# Patient Record
Sex: Female | Born: 1985 | Race: Black or African American | Hispanic: No | Marital: Single | Smoking: Never smoker
Health system: Southern US, Community
[De-identification: ages and names within clinical notes are randomized; demographics above are authoritative.]

## PROBLEM LIST (undated history)

## (undated) ENCOUNTER — Inpatient Hospital Stay (HOSPITAL_COMMUNITY): Payer: Self-pay

## (undated) DIAGNOSIS — D649 Anemia, unspecified: Secondary | ICD-10-CM

## (undated) DIAGNOSIS — Z889 Allergy status to unspecified drugs, medicaments and biological substances status: Secondary | ICD-10-CM

## (undated) HISTORY — PX: NO PAST SURGERIES: SHX2092

---
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
## 2008-01-02 ENCOUNTER — Emergency Department (HOSPITAL_COMMUNITY): Admission: EM | Admit: 2008-01-02 | Discharge: 2008-01-03 | Payer: Self-pay | Admitting: Emergency Medicine

## 2011-04-30 LAB — URINALYSIS, ROUTINE W REFLEX MICROSCOPIC
Bilirubin Urine: NEGATIVE
Glucose, UA: NEGATIVE
Ketones, ur: NEGATIVE
Nitrite: NEGATIVE
Protein, ur: NEGATIVE
Specific Gravity, Urine: 1.031 — ABNORMAL HIGH
Urobilinogen, UA: 1
pH: 6

## 2011-04-30 LAB — DIFFERENTIAL
Eosinophils Relative: 2
Monocytes Absolute: 0.6
Monocytes Relative: 6
Neutrophils Relative %: 65

## 2011-04-30 LAB — POCT I-STAT, CHEM 8
BUN: 10
HCT: 35 — ABNORMAL LOW
Hemoglobin: 11.9 — ABNORMAL LOW
Sodium: 141
TCO2: 28

## 2011-04-30 LAB — GC/CHLAMYDIA PROBE AMP, GENITAL
Chlamydia, DNA Probe: POSITIVE — AB
GC Probe Amp, Genital: NEGATIVE

## 2011-04-30 LAB — URINE MICROSCOPIC-ADD ON

## 2011-04-30 LAB — CBC
MCV: 68.7 — ABNORMAL LOW
Platelets: 284
WBC: 9.6

## 2011-04-30 LAB — POCT PREGNANCY, URINE
Operator id: 261601
Preg Test, Ur: NEGATIVE

## 2011-04-30 LAB — WET PREP, GENITAL: Trich, Wet Prep: NONE SEEN

## 2012-08-04 NOTE — L&D Delivery Note (Signed)
Delivery Note  As pt was being placed in stirrups, the vtx began crowning. After one push, at 4:55 AM a viable female was delivered via Vaginal, Spontaneous Delivery (Presentation: Left Occiput Anterior).  APGAR: 8, 9; weight pending.   Placenta status: , Spontaneous.  Cord: 3 vessels with the following complications: None.   Anesthesia: Epidural  Lacerations: 1st degree;vaginal wall Suture Repair: vicryl 4-0 Est. Blood Loss (mL): 250  Mom to postpartum.  Baby to nursery-stable.  [name] 02/09/2013, 5:24 AM  Due to precipitous nature of delivery, I was not present at the delivery.  I was present a few minutes after, and agree with above assessment.

## 2012-12-30 ENCOUNTER — Encounter (HOSPITAL_COMMUNITY): Payer: Self-pay | Admitting: *Deleted

## 2012-12-30 ENCOUNTER — Inpatient Hospital Stay (HOSPITAL_COMMUNITY)
Admission: AD | Admit: 2012-12-30 | Discharge: 2012-12-31 | Disposition: A | Discharge status: Home / Self Care | Payer: Medicaid - Out of State | Source: Ambulatory Visit | Attending: Family Medicine | Admitting: Family Medicine

## 2012-12-30 DIAGNOSIS — O99891 Other specified diseases and conditions complicating pregnancy: Secondary | ICD-10-CM | POA: Insufficient documentation

## 2012-12-30 DIAGNOSIS — Z3493 Encounter for supervision of normal pregnancy, unspecified, third trimester: Secondary | ICD-10-CM

## 2012-12-30 DIAGNOSIS — N899 Noninflammatory disorder of vagina, unspecified: Secondary | ICD-10-CM | POA: Insufficient documentation

## 2012-12-30 DIAGNOSIS — H1130 Conjunctival hemorrhage, unspecified eye: Secondary | ICD-10-CM

## 2012-12-30 DIAGNOSIS — J069 Acute upper respiratory infection, unspecified: Secondary | ICD-10-CM | POA: Insufficient documentation

## 2012-12-30 HISTORY — DX: Anemia, unspecified: D64.9

## 2012-12-30 HISTORY — DX: Allergy status to unspecified drugs, medicaments and biological substances: Z88.9

## 2012-12-30 LAB — URINALYSIS, ROUTINE W REFLEX MICROSCOPIC
Glucose, UA: NEGATIVE mg/dL
Specific Gravity, Urine: 1.01 (ref 1.005–1.030)
pH: 6 (ref 5.0–8.0)

## 2012-12-30 LAB — URINE MICROSCOPIC-ADD ON

## 2012-12-30 MED ORDER — GUAIFENESIN ER 600 MG PO TB12: 1200.0000 mg | ORAL_TABLET | Freq: Two times a day (BID) | ORAL | Status: DC

## 2012-12-30 MED ORDER — PSEUDOEPHEDRINE HCL 60 MG PO TABS: 60.0000 mg | ORAL_TABLET | ORAL | Status: DC | PRN

## 2012-12-30 NOTE — Discharge Instructions (Signed)

## 2012-12-30 NOTE — MAU Provider Note (Signed)
History     CSN: [id number]  Arrival date and time: 12/30/12 1945   First Provider Initiated Contact with Patient 12/30/12 2303      Chief Complaint  Patient presents with  . Sinus Problem  . Eye Problem   HPI Ms Fesler is a 26yo G1 at 34.3wks by LMP who presents for eval of various general symptoms and a 'check-up' as she has not had prenatal care other than a few ER visits in [place] earlier in the pregnancy. At this time she complains of a painful 'boil' on her vagina x 1wk that is starting to resolve now and a cough and sinus pressure recently. Denies fever, UTI s/s, diarrhea, leaking, bldg, ctx and reports +FM. Has applied for [facility] already.  OB History   Grav Para Term Preterm Abortions TAB SAB Ect Mult Living   1               Past Medical History  Diagnosis Date  . Anemia   . H/O seasonal allergies     Past Surgical History  Procedure Laterality Date  . No past surgeries      Family History  Problem Relation Age of Onset  . Hypertension Mother   . Hypertension Father   . Hypertension Maternal Uncle   . Kidney disease Maternal Uncle   . Diabetes Maternal Uncle   . Hypertension Maternal Grandmother   . Cancer Maternal Grandmother   . Hypertension Maternal Grandfather   . Hypertension Paternal Grandmother   . Hypertension Paternal Grandfather     History  Substance Use Topics  . Smoking status: Never Smoker   . Smokeless tobacco: Not on file  . Alcohol Use: Yes    Allergies: No Known Allergies  Prescriptions prior to admission  Medication Sig Dispense Refill  . Prenatal Vit-Fe Fumarate-FA (PRENATAL MULTIVITAMIN) TABS Take 1 tablet by mouth daily at 12 noon.        ROS Physical Exam   Blood pressure 136/88, pulse 88, temperature 98.2 F (36.8 C), temperature source Oral, resp. rate 20, height 5\' 10"  (1.778 m), weight 163.93 kg (361 lb 6.4 oz), last menstrual period 05/03/2012.  Physical Exam  Constitutional: She is oriented to person, place, and  time. She appears well-developed.  Morbidly obese  HENT:  Head: Normocephalic.  Eyes:  Bilateral subconjunctival hemorrhages  Neck: Normal range of motion.  Cardiovascular: Normal rate.   Respiratory: Effort normal.  GI:  Very obese abd; fundal height not performed  FHR 135 +accels, no decels No ctx per toco  Genitourinary:  Right labia: approx 1cm crater-like lesion; sm amber d/c; sm bleeding noted during culture collection; very tender to palp  Musculoskeletal: Normal range of motion.  Neurological: She is alert and oriented to person, place, and time.  Skin: Skin is warm and dry.  Psychiatric: She has a normal mood and affect. Her behavior is normal. Thought content normal.   Urinalysis    Component Value Date/Time   COLORURINE YELLOW 12/30/2012 2014   APPEARANCEUR HAZY* 12/30/2012 2014   LABSPEC 1.010 12/30/2012 2014   PHURINE 6.0 12/30/2012 2014   GLUCOSEU NEGATIVE 12/30/2012 2014   HGBUR SMALL* 12/30/2012 2014   BILIRUBINUR NEGATIVE 12/30/2012 2014   KETONESUR NEGATIVE 12/30/2012 2014   PROTEINUR NEGATIVE 12/30/2012 2014   UROBILINOGEN 0.2 12/30/2012 2014   NITRITE NEGATIVE 12/30/2012 2014   LEUKOCYTESUR LARGE* 12/30/2012 2014      MAU Course  Procedures  MDM Pt declines any prev dx of HSV II; culture  collected from labial lesion  Assessment and Plan  IUP at 34.3wks by LMP No PNC Lesion suspicious for HSV URI Bilat subconjunctival hemorrhages  D/C home Inbox msg to [facility] for NOB appt Outpt anatomy U/S ordered UDS pending HSV culture pending Urine culture pending Rev'd subconjunctival hemorrhages most likely from strong coughing Rec OTC Mucinex and Sudafed for URI  Flynt Breeze 12/30/2012, 11:27 PM

## 2012-12-30 NOTE — MAU Note (Signed)
Pt LMP 05/03/2012, no prenatal care.  Recently moved here from [place].  Having sinus issues, left eye swollen.  Both eyes with broken blood vessels.  Vaginal boil x 1 wk.

## 2012-12-31 ENCOUNTER — Telehealth: Payer: Self-pay | Admitting: *Deleted

## 2012-12-31 LAB — RAPID URINE DRUG SCREEN, HOSP PERFORMED
Amphetamines: NOT DETECTED
Cocaine: NOT DETECTED
Opiates: NOT DETECTED
Tetrahydrocannabinol: NOT DETECTED

## 2012-12-31 NOTE — MAU Provider Note (Signed)
Chart reviewed and agree with management and plan.  

## 2012-12-31 NOTE — Telephone Encounter (Signed)
Pt left message stating that she was seen @ MAU yesterday. She is supposed to get an appt for [place] and prenatal visit - wanting to know if these appts have been scheduled. I returned pt's call and informed her of [place] appt on 6/4 @ 1300 and clinic appt on 6/18 @ 0800.  Pt voiced understanding.

## 2013-01-01 LAB — URINE CULTURE

## 2013-01-02 ENCOUNTER — Other Ambulatory Visit: Payer: Self-pay | Admitting: Advanced Practice Midwife

## 2013-01-02 DIAGNOSIS — O2343 Unspecified infection of urinary tract in pregnancy, third trimester: Secondary | ICD-10-CM

## 2013-01-02 MED ORDER — AMOXICILLIN 500 MG PO TABS: 500.0000 mg | ORAL_TABLET | Freq: Two times a day (BID) | ORAL | Status: DC

## 2013-01-02 NOTE — Progress Notes (Signed)
Urine culture + e coli, rx amoxicillin 500 mg i po bid x 7 days.

## 2013-01-05 ENCOUNTER — Ambulatory Visit (HOSPITAL_COMMUNITY)
Admission: RE | Admit: 2013-01-05 | Discharge: 2013-01-05 | Disposition: A | Discharge status: Home / Self Care | Payer: Medicaid - Out of State | Source: Ambulatory Visit | Attending: Advanced Practice Midwife | Admitting: Advanced Practice Midwife

## 2013-01-05 DIAGNOSIS — E669 Obesity, unspecified: Secondary | ICD-10-CM | POA: Insufficient documentation

## 2013-01-05 DIAGNOSIS — O0930 Supervision of pregnancy with insufficient antenatal care, unspecified trimester: Secondary | ICD-10-CM | POA: Insufficient documentation

## 2013-01-05 DIAGNOSIS — Z3493 Encounter for supervision of normal pregnancy, unspecified, third trimester: Secondary | ICD-10-CM

## 2013-01-05 DIAGNOSIS — O99210 Obesity complicating pregnancy, unspecified trimester: Secondary | ICD-10-CM | POA: Insufficient documentation

## 2013-01-19 ENCOUNTER — Ambulatory Visit (INDEPENDENT_AMBULATORY_CARE_PROVIDER_SITE_OTHER): Payer: Medicaid Other | Admitting: Obstetrics and Gynecology

## 2013-01-19 ENCOUNTER — Encounter: Payer: Self-pay | Admitting: Obstetrics and Gynecology

## 2013-01-19 VITALS — BP 116/78 | Temp 98.7°F | Wt 355.8 lb

## 2013-01-19 DIAGNOSIS — R6889 Other general symptoms and signs: Secondary | ICD-10-CM

## 2013-01-19 DIAGNOSIS — O0930 Supervision of pregnancy with insufficient antenatal care, unspecified trimester: Secondary | ICD-10-CM

## 2013-01-19 DIAGNOSIS — O98319 Other infections with a predominantly sexual mode of transmission complicating pregnancy, unspecified trimester: Secondary | ICD-10-CM

## 2013-01-19 DIAGNOSIS — IMO0002 Reserved for concepts with insufficient information to code with codable children: Secondary | ICD-10-CM

## 2013-01-19 DIAGNOSIS — O0933 Supervision of pregnancy with insufficient antenatal care, third trimester: Secondary | ICD-10-CM | POA: Insufficient documentation

## 2013-01-19 DIAGNOSIS — A568 Sexually transmitted chlamydial infection of other sites: Secondary | ICD-10-CM

## 2013-01-19 DIAGNOSIS — A749 Chlamydial infection, unspecified: Secondary | ICD-10-CM | POA: Insufficient documentation

## 2013-01-19 DIAGNOSIS — N898 Other specified noninflammatory disorders of vagina: Secondary | ICD-10-CM

## 2013-01-19 DIAGNOSIS — O98813 Other maternal infectious and parasitic diseases complicating pregnancy, third trimester: Secondary | ICD-10-CM

## 2013-01-19 LAB — OB RESULTS CONSOLE GBS: GBS: NEGATIVE

## 2013-01-19 LAB — POCT URINALYSIS DIP (DEVICE)
Bilirubin Urine: NEGATIVE
Hgb urine dipstick: NEGATIVE
Nitrite: NEGATIVE
Protein, ur: NEGATIVE mg/dL
Urobilinogen, UA: 0.2 mg/dL (ref 0.0–1.0)
pH: 6.5 (ref 5.0–8.0)

## 2013-01-19 LAB — WET PREP, GENITAL: Trich, Wet Prep: NONE SEEN

## 2013-01-19 NOTE — Addendum Note (Signed)
Addended by: Sherre Lain A on: 01/19/2013 03:36 PM   Modules accepted: Orders

## 2013-01-19 NOTE — Addendum Note (Signed)
Addended by: Franchot Mimes on: 01/19/2013 11:35 AM   Modules accepted: Orders

## 2013-01-19 NOTE — Progress Notes (Signed)
Subjective:    Beth Calderon is a G1P0 [redacted]w[redacted]d being seen today for her first obstetrical visit. NPC and recent move from [place] to live with mother. FOB involved. Had sporadic ED care in [place] including bedside US>no records. Her obstetrical history is significant for morbid obesity, chlamydia not treated, few clues on prior WP. LMP known but states had normal menses 2 wks prior. [place] at 34 wks 1 wk less advanced with discordant measurements. Patient does intend to breast feed. Pregnancy history fully reviewed.  Patient reports no bleeding, no contractions and no leaking.  Filed Vitals:   01/19/13 0750  BP: 116/78  Temp: 98.7 F (37.1 C)  Weight: 355 lb 12.8 oz (161.39 kg)    HISTORY: OB History   Grav Para Term Preterm Abortions TAB SAB Ect Mult Living   1              # Outc Date GA Lbr Len/2nd Wgt Sex Del Anes PTL Lv   1 CUR              Past Medical History  Diagnosis Date  . Anemia   . H/O seasonal allergies    Past Surgical History  Procedure Laterality Date  . No past surgeries     Family History  Problem Relation Age of Onset  . Hypertension Mother   . Hypertension Father   . Hypertension Maternal Uncle   . Kidney disease Maternal Uncle   . Diabetes Maternal Uncle   . Hypertension Maternal Grandmother   . Cancer Maternal Grandmother   . Hypertension Maternal Grandfather   . Hypertension Paternal Grandmother   . Hypertension Paternal Grandfather      Exam    Uterus:  Fundal Height: 42 cm  Pelvic Exam:    Perineum: Normal Perineum   Vulva: normal   Vagina:  normal discharge, wet prep done       Cervix: no bleeding following Pap and no lesions    Adnexa: normal adnexa   Bony Pelvis: capacious  System: Breast:  normal appearance, no masses or tenderness   Skin: normal coloration and turgor, no rashes    Neurologic: oriented, normal, grossly non-focal   Extremities: normal strength, tone, and muscle mass, no deformities   HEENT PERRLA   Mouth/Teeth  mucous membranes moist, pharynx normal without lesions and dental hygiene good   Neck supple and no masses   Cardiovascular: regular rate and rhythm, no murmurs or gallops   Respiratory:  appears well, vitals normal, no respiratory distress, acyanotic, normal RR, ear and throat exam is normal, neck free of mass or lymphadenopathy, chest clear, no wheezing, crepitations, rhonchi, normal symmetric air entry   Abdomen: term size, morbidly obese. FHR 144   Urinary: urethral meatus normal      Assessment:    Pregnancy: G1P0 Patient Active Problem List   Diagnosis Date Noted  . Insufficient prenatal care in third trimester 01/19/2013  . Chlamydia infection complicating pregnancy in third trimester 01/19/2013  . Morbid obesity 01/19/2013        Plan:     Initial labs drawn. Glucola done. GBS sent. Prenatal vitamins. Problem list reviewed and updated. Genetic Screening discussed Quad Screen: too late.  Ultrasound discussed; fetal survey: results reviewed.  Follow up in 1 weeks. 50% of 30 min visit spent on counseling and coordination of care.  F/U US scheduled due to discordant measurements on first, S>D, Unsure dating criteria.  Will treat CT here and advise no sex until  TOC and inform partner. WP done.  Isrrael Fluckiger 01/19/2013

## 2013-01-19 NOTE — Patient Instructions (Addendum)
Chlamydia, Female Chlamydia is an infection caused by bacteria. It is spread through sexual contact. Chlamydia can be in different areas of the body. These areas include the cervix, urethra, throat, or rectum. If you are infected, you must finish all treatments and follow up with a caregiver.  CAUSES  Chlamydia is a sexually transmitted disease. It is passed from an infected partner during intimate contact. This contact could be with the genitals, mouth, or rectal area. Infections can also be passed from mothers to babies during birth. SYMPTOMS  There may not be any symptoms. This is often the case early in the infection. Symptoms you may notice include:  Mild pain and discomfort when urinating.  Inflammation of the rectum.  Vaginal discharge.  Painful intercourse.  Abdominal pain.  Bleeding between menstrual periods. DIAGNOSIS  To diagnose this infection, your caregiver will do a pelvic exam. Cultures will be taken of the vagina, cervix, urine, and possibly the rectum to see if the infection is chlamydia. TREATMENT You will be given antibiotic medicines. Any sexual partners should also be treated, even if they do not show symptoms. Take the medicine for the prescribed length of time. If you are pregnant, do not take tetracycline-type antibiotics. HOME CARE INSTRUCTIONS   Take your antibiotics as directed. Finish them even if you start to feel better.  Only take over-the-counter or prescription medicines for pain, discomfort, or fever as directed by your caregiver.  Inform any sexual partners about the infection. They should be treated also.  Do not have sexual contact until your caregiver tells you it is okay.  Get plenty of rest.  Eat a well-balanced diet, and drink enough fluids to keep your urine clear or pale yellow.  Keep all follow-up appointments and tests. SEEK IMMEDIATE MEDICAL CARE IF:   Your symptoms return.  You have a fever. MAKE SURE YOU:   Understand these  instructions.  Will watch your condition.  Will get help right away if you are not doing well or get worse. Document Released: 04/30/2005 Document Revised: 10/13/2011 Document Reviewed: 03/08/2008 [facility] Patient Information 2014 [place], [place]. Pregnancy - Third Trimester The third trimester of pregnancy (the last 3 months) is a period of the most rapid growth for you and your baby. The baby approaches a length of 20 inches and a weight of 6 to 10 pounds. The baby is adding on fat and getting ready for life outside your body. While inside, babies have periods of sleeping and waking, sucking thumbs, and hiccuping. You can often feel small contractions of the uterus. This is false labor. It is also called Braxton-Hicks contractions. This is like a practice for labor. The usual problems in this stage of pregnancy include more difficulty breathing, swelling of the hands and feet from water retention, and having to urinate more often because of the uterus and baby pressing on your bladder.  PRENATAL EXAMS  Blood work may continue to be done during prenatal exams. These tests are done to check on your health and the probable health of your baby. Blood work is used to follow your blood levels (hemoglobin). Anemia (low hemoglobin) is common during pregnancy. Iron and vitamins are given to help prevent this. You may also continue to be checked for diabetes. Some of the past blood tests may be done again.  The size of the uterus is measured during each visit. This makes sure your baby is growing properly according to your pregnancy dates.  Your blood pressure is checked every prenatal visit.  This is to make sure you are not getting toxemia.  Your urine is checked every prenatal visit for infection, diabetes, and protein.  Your weight is checked at each visit. This is done to make sure gains are happening at the suggested rate and that you and your baby are growing normally.  Sometimes, an ultrasound is  performed to confirm the position and the proper growth and development of the baby. This is a test done that bounces harmless sound waves off the baby so your caregiver can more accurately determine a due date.  Discuss the type of pain medicine and anesthesia you will have during your labor and delivery.  Discuss the possibility and anesthesia if a cesarean section might be necessary.  Inform your caregiver if there is any mental or physical violence at home. Sometimes, a specialized non-stress test, contraction stress test, and biophysical profile are done to make sure the baby is not having a problem. Checking the amniotic fluid surrounding the baby is called an amniocentesis. The amniotic fluid is removed by sticking a needle into the belly (abdomen). This is sometimes done near the end of pregnancy if an early delivery is required. In this case, it is done to help make sure the baby's lungs are mature enough for the baby to live outside of the womb. If the lungs are not mature and it is unsafe to deliver the baby, an injection of cortisone medicine is given to the mother 1 to 2 days before the delivery. This helps the baby's lungs mature and makes it safer to deliver the baby. CHANGES OCCURING IN THE THIRD TRIMESTER OF PREGNANCY Your body goes through many changes during pregnancy. They vary from person to person. Talk to your caregiver about changes you notice and are concerned about.  During the last trimester, you have probably had an increase in your appetite. It is normal to have cravings for certain foods. This varies from person to person and pregnancy to pregnancy.  You may begin to get stretch marks on your hips, abdomen, and breasts. These are normal changes in the body during pregnancy. There are no exercises or medicines to take which prevent this change.  Constipation may be treated with a stool softener or adding bulk to your diet. Drinking lots of fluids, fiber in vegetables,  fruits, and whole grains are helpful.  Exercising is also helpful. If you have been very active up until your pregnancy, most of these activities can be continued during your pregnancy. If you have been less active, it is helpful to start an exercise program such as walking. Consult your caregiver before starting exercise programs.  Avoid all smoking, alcohol, non-prescribed drugs, herbs and "street drugs" during your pregnancy. These chemicals affect the formation and growth of the baby. Avoid chemicals throughout the pregnancy to ensure the delivery of a healthy infant.  Backache, varicose veins, and hemorrhoids may develop or get worse.  You will tire more easily in the third trimester, which is normal.  The baby's movements may be stronger and more often.  You may become short of breath easily.  Your belly button may stick out.  A yellow discharge may leak from your breasts called colostrum.  You may have a bloody mucus discharge. This usually occurs a few days to a week before labor begins. HOME CARE INSTRUCTIONS   Keep your caregiver's appointments. Follow your caregiver's instructions regarding medicine use, exercise, and diet.  During pregnancy, you are providing food for you and your baby.  Continue to eat regular, well-balanced meals. Choose foods such as meat, fish, milk and other low fat dairy products, vegetables, fruits, and whole-grain breads and cereals. Your caregiver will tell you of the ideal weight gain.  A physical sexual relationship may be continued throughout pregnancy if there are no other problems such as early (premature) leaking of amniotic fluid from the membranes, vaginal bleeding, or belly (abdominal) pain.  Exercise regularly if there are no restrictions. Check with your caregiver if you are unsure of the safety of your exercises. Greater weight gain will occur in the last 2 trimesters of pregnancy. Exercising helps:  Control your weight.  Get you in  shape for labor and delivery.  You lose weight after you deliver.  Rest a lot with legs elevated, or as needed for leg cramps or low back pain.  Wear a good support or jogging bra for breast tenderness during pregnancy. This may help if worn during sleep. Pads or tissues may be used in the bra if you are leaking colostrum.  Do not use hot tubs, steam rooms, or saunas.  Wear your seat belt when driving. This protects you and your baby if you are in an accident.  Avoid raw meat, cat litter boxes and soil used by cats. These carry germs that can cause birth defects in the baby.  It is easier to leak urine during pregnancy. Tightening up and strengthening the pelvic muscles will help with this problem. You can practice stopping your urination while you are going to the bathroom. These are the same muscles you need to strengthen. It is also the muscles you would use if you were trying to stop from passing gas. You can practice tightening these muscles up 10 times a set and repeating this about 3 times per day. Once you know what muscles to tighten up, do not perform these exercises during urination. It is more likely to cause an infection by backing up the urine.  Ask for help if you have financial, counseling, or nutritional needs during pregnancy. Your caregiver will be able to offer counseling for these needs as well as refer you for other special needs.  Make a list of emergency phone numbers and have them available.  Plan on getting help from family or friends when you go home from the hospital.  Make a trial run to the hospital.  Take prenatal classes with the father to understand, practice, and ask questions about the labor and delivery.  Prepare the baby's room or nursery.  Do not travel out of the city unless it is absolutely necessary and with the advice of your caregiver.  Wear only low or no heal shoes to have better balance and prevent falling. MEDICINES AND DRUG USE IN  PREGNANCY  Take prenatal vitamins as directed. The vitamin should contain 1 milligram of folic acid. Keep all vitamins out of reach of children. Only a couple vitamins or tablets containing iron may be fatal to a baby or young child when ingested.  Avoid use of all medicines, including herbs, over-the-counter medicines, not prescribed or suggested by your caregiver. Only take over-the-counter or prescription medicines for pain, discomfort, or fever as directed by your caregiver. Do not use aspirin, ibuprofen or naproxen unless approved by your caregiver.  Let your caregiver also know about herbs you may be using.  Alcohol is related to a number of birth defects. This includes fetal alcohol syndrome. All alcohol, in any form, should be avoided completely. Smoking will cause low  birth rate and premature babies.  Illegal drugs are very harmful to the baby. They are absolutely forbidden. A baby born to an addicted mother will be addicted at birth. The baby will go through the same withdrawal an adult does. SEEK MEDICAL CARE IF: You have any concerns or worries during your pregnancy. It is better to call with your questions if you feel they cannot wait, rather than worry about them. SEEK IMMEDIATE MEDICAL CARE IF:   An unexplained oral temperature above 102 F (38.9 C) develops, or as your caregiver suggests.  You have leaking of fluid from the vagina. If leaking membranes are suspected, take your temperature and tell your caregiver of this when you call.  There is vaginal spotting, bleeding or passing clots. Tell your caregiver of the amount and how many pads are used.  You develop a bad smelling vaginal discharge with a change in the color from clear to white.  You develop vomiting that lasts more than 24 hours.  You develop chills or fever.  You develop shortness of breath.  You develop burning on urination.  You loose more than 2 pounds of weight or gain more than 2 pounds of weight or  as suggested by your caregiver.  You notice sudden swelling of your face, hands, and feet or legs.  You develop belly (abdominal) pain. Round ligament discomfort is a common non-cancerous (benign) cause of abdominal pain in pregnancy. Your caregiver still must evaluate you.  You develop a severe headache that does not go away.  You develop visual problems, blurred or double vision.  If you have not felt your baby move for more than 1 hour. If you think the baby is not moving as much as usual, eat something with sugar in it and lie down on your left side for an hour. The baby should move at least 4 to 5 times per hour. Call right away if your baby moves less than that.  You fall, are in a car accident, or any kind of trauma.  There is mental or physical violence at home. Document Released: 07/15/2001 Document Revised: 04/14/2012 Document Reviewed: 01/17/2009 [facility] Patient Information 2014 [place], [place].

## 2013-01-19 NOTE — Progress Notes (Signed)
Pulse: 89 Initial Prenatal Labs today. 1hr due at [phone]

## 2013-01-20 LAB — HIV ANTIBODY (ROUTINE TESTING W REFLEX): HIV: NONREACTIVE

## 2013-01-21 LAB — OBSTETRIC PANEL
Eosinophils Absolute: 0.1 10*3/uL (ref 0.0–0.7)
Eosinophils Relative: 1 % (ref 0–5)
Hemoglobin: 10.1 g/dL — ABNORMAL LOW (ref 12.0–15.0)
Hepatitis B Surface Ag: NEGATIVE
Lymphocytes Relative: 27 % (ref 12–46)
Lymphs Abs: 2.3 10*3/uL (ref 0.7–4.0)
MCH: 24.9 pg — ABNORMAL LOW (ref 26.0–34.0)
MCV: 74.6 fL — ABNORMAL LOW (ref 78.0–100.0)
Monocytes Relative: 9 % (ref 3–12)
Neutrophils Relative %: 63 % (ref 43–77)
RBC: 4.06 MIL/uL (ref 3.87–5.11)
Rh Type: POSITIVE
Rubella: 10 Index — ABNORMAL HIGH (ref <0.90)
WBC: 8.4 10*3/uL (ref 4.0–10.5)

## 2013-01-21 LAB — CULTURE, OB URINE: Colony Count: >=100000

## 2013-01-21 LAB — HEMOGLOBINOPATHY EVALUATION
Hgb F Quant: 0 % (ref 0.0–2.0)
Hgb S Quant: 0 %

## 2013-01-26 ENCOUNTER — Ambulatory Visit (INDEPENDENT_AMBULATORY_CARE_PROVIDER_SITE_OTHER): Payer: Medicaid Other | Admitting: Advanced Practice Midwife

## 2013-01-26 VITALS — BP 133/88 | Temp 97.0°F | Wt 355.0 lb

## 2013-01-26 DIAGNOSIS — O0930 Supervision of pregnancy with insufficient antenatal care, unspecified trimester: Secondary | ICD-10-CM

## 2013-01-26 DIAGNOSIS — O0933 Supervision of pregnancy with insufficient antenatal care, third trimester: Secondary | ICD-10-CM

## 2013-01-26 LAB — POCT URINALYSIS DIP (DEVICE)
Bilirubin Urine: NEGATIVE
Glucose, UA: NEGATIVE mg/dL
Ketones, ur: NEGATIVE mg/dL
Nitrite: NEGATIVE
Specific Gravity, Urine: 1.02 (ref 1.005–1.030)
pH: 6.5 (ref 5.0–8.0)

## 2013-01-26 NOTE — Progress Notes (Signed)
Pt states she had an early ultrasound done in [place] with a prior provider that she states gave her an EDD of 02/07/13. We will attempt to get a copy of the record.

## 2013-01-26 NOTE — Progress Notes (Signed)
Pulse: 90

## 2013-02-01 DIAGNOSIS — IMO0002 Reserved for concepts with insufficient information to code with codable children: Secondary | ICD-10-CM | POA: Insufficient documentation

## 2013-02-02 ENCOUNTER — Ambulatory Visit (INDEPENDENT_AMBULATORY_CARE_PROVIDER_SITE_OTHER): Payer: Medicaid Other | Admitting: Advanced Practice Midwife

## 2013-02-02 VITALS — BP 139/87 | Temp 97.6°F | Wt 362.1 lb

## 2013-02-02 DIAGNOSIS — O98813 Other maternal infectious and parasitic diseases complicating pregnancy, third trimester: Secondary | ICD-10-CM

## 2013-02-02 DIAGNOSIS — Z3403 Encounter for supervision of normal first pregnancy, third trimester: Secondary | ICD-10-CM

## 2013-02-02 DIAGNOSIS — O98319 Other infections with a predominantly sexual mode of transmission complicating pregnancy, unspecified trimester: Secondary | ICD-10-CM

## 2013-02-02 DIAGNOSIS — A568 Sexually transmitted chlamydial infection of other sites: Secondary | ICD-10-CM

## 2013-02-02 LAB — POCT URINALYSIS DIP (DEVICE)
Bilirubin Urine: NEGATIVE
Glucose, UA: NEGATIVE mg/dL
Hgb urine dipstick: NEGATIVE
Specific Gravity, Urine: >=1.03 (ref 1.005–1.030)
Urobilinogen, UA: 1 mg/dL (ref 0.0–1.0)

## 2013-02-02 LAB — OB RESULTS CONSOLE GC/CHLAMYDIA: Gonorrhea: NEGATIVE

## 2013-02-02 NOTE — Progress Notes (Signed)
GBS negative, GC TOC today by urine. No c/o. Rev'd labor precautions and kick counts.

## 2013-02-02 NOTE — Progress Notes (Signed)
Pulse- 78 Pt reports pink-tinged discharge

## 2013-02-03 LAB — GC/CHLAMYDIA PROBE AMP: GC Probe RNA: NEGATIVE

## 2013-02-08 ENCOUNTER — Inpatient Hospital Stay (HOSPITAL_COMMUNITY)
Admission: AD | Admit: 2013-02-08 | Discharge: 2013-02-11 | DRG: 775 | Disposition: A | Discharge status: Home / Self Care | Payer: Medicaid Other | Source: Ambulatory Visit | Attending: Obstetrics & Gynecology | Admitting: Obstetrics & Gynecology

## 2013-02-08 DIAGNOSIS — O0930 Supervision of pregnancy with insufficient antenatal care, unspecified trimester: Secondary | ICD-10-CM

## 2013-02-08 DIAGNOSIS — E669 Obesity, unspecified: Secondary | ICD-10-CM | POA: Diagnosis present

## 2013-02-09 ENCOUNTER — Encounter (HOSPITAL_COMMUNITY): Payer: Self-pay | Admitting: Anesthesiology

## 2013-02-09 ENCOUNTER — Encounter (HOSPITAL_COMMUNITY): Payer: Self-pay | Admitting: *Deleted

## 2013-02-09 ENCOUNTER — Inpatient Hospital Stay (HOSPITAL_COMMUNITY): Payer: Medicaid Other | Admitting: Anesthesiology

## 2013-02-09 ENCOUNTER — Encounter: Payer: Self-pay | Admitting: Obstetrics and Gynecology

## 2013-02-09 DIAGNOSIS — O99214 Obesity complicating childbirth: Secondary | ICD-10-CM

## 2013-02-09 DIAGNOSIS — E669 Obesity, unspecified: Secondary | ICD-10-CM

## 2013-02-09 LAB — CBC
MCH: 25.7 pg — ABNORMAL LOW (ref 26.0–34.0)
Platelets: 178 10*3/uL (ref 150–400)
RBC: 4.35 MIL/uL (ref 3.87–5.11)
RDW: 16.3 % — ABNORMAL HIGH (ref 11.5–15.5)

## 2013-02-09 LAB — TYPE AND SCREEN

## 2013-02-09 LAB — RPR: RPR Ser Ql: NONREACTIVE

## 2013-02-09 MED ORDER — WITCH HAZEL-GLYCERIN EX PADS: 1.0000 "application " | MEDICATED_PAD | CUTANEOUS | Status: DC | PRN

## 2013-02-09 MED ORDER — ACETAMINOPHEN 325 MG PO TABS: 650.0000 mg | ORAL_TABLET | ORAL | Status: DC | PRN

## 2013-02-09 MED ORDER — ONDANSETRON HCL 4 MG/2ML IJ SOLN: 4.0000 mg | INTRAMUSCULAR | Status: DC | PRN

## 2013-02-09 MED ORDER — IBUPROFEN 600 MG PO TABS: 600.0000 mg | ORAL_TABLET | Freq: Four times a day (QID) | ORAL | Status: DC | PRN

## 2013-02-09 MED ORDER — FENTANYL 2.5 MCG/ML BUPIVACAINE 1/10 % EPIDURAL INFUSION (WH - ANES)
INTRAMUSCULAR | Status: DC | PRN
  Administered 2013-02-09: 14 mL/h via EPIDURAL

## 2013-02-09 MED ORDER — LIDOCAINE HCL (PF) 1 % IJ SOLN
30.0000 mL | INTRAMUSCULAR | Status: DC | PRN
  Filled 2013-02-09 (×2): qty 30

## 2013-02-09 MED ORDER — LANOLIN HYDROUS EX OINT: TOPICAL_OINTMENT | CUTANEOUS | Status: DC | PRN

## 2013-02-09 MED ORDER — TETANUS-DIPHTH-ACELL PERTUSSIS 5-2.5-18.5 LF-MCG/0.5 IM SUSP
0.5000 mL | Freq: Once | INTRAMUSCULAR | Status: AC
  Administered 2013-02-10: 0.5 mL via INTRAMUSCULAR

## 2013-02-09 MED ORDER — LACTATED RINGERS IV SOLN
INTRAVENOUS | Status: DC
  Administered 2013-02-09 (×2): via INTRAVENOUS

## 2013-02-09 MED ORDER — PHENYLEPHRINE 40 MCG/ML (10ML) SYRINGE FOR IV PUSH (FOR BLOOD PRESSURE SUPPORT)
80.0000 ug | PREFILLED_SYRINGE | INTRAVENOUS | Status: DC | PRN
  Filled 2013-02-09: qty 2
  Filled 2013-02-09 (×2): qty 5

## 2013-02-09 MED ORDER — LACTATED RINGERS IV SOLN
500.0000 mL | Freq: Once | INTRAVENOUS | Status: AC
  Administered 2013-02-09: 500 mL via INTRAVENOUS

## 2013-02-09 MED ORDER — OXYTOCIN 40 UNITS IN LACTATED RINGERS INFUSION - SIMPLE MED
62.5000 mL/h | INTRAVENOUS | Status: DC
  Administered 2013-02-09: 62.5 mL/h via INTRAVENOUS
  Filled 2013-02-09: qty 1000

## 2013-02-09 MED ORDER — BENZOCAINE-MENTHOL 20-0.5 % EX AERO
1.0000 "application " | INHALATION_SPRAY | CUTANEOUS | Status: DC | PRN
  Administered 2013-02-09: 1 via TOPICAL
  Filled 2013-02-09: qty 56

## 2013-02-09 MED ORDER — OXYCODONE-ACETAMINOPHEN 5-325 MG PO TABS: 1.0000 | ORAL_TABLET | ORAL | Status: DC | PRN

## 2013-02-09 MED ORDER — PHENYLEPHRINE 40 MCG/ML (10ML) SYRINGE FOR IV PUSH (FOR BLOOD PRESSURE SUPPORT)
80.0000 ug | PREFILLED_SYRINGE | INTRAVENOUS | Status: DC | PRN
  Filled 2013-02-09: qty 2

## 2013-02-09 MED ORDER — FENTANYL 2.5 MCG/ML BUPIVACAINE 1/10 % EPIDURAL INFUSION (WH - ANES)
14.0000 mL/h | INTRAMUSCULAR | Status: DC | PRN
  Filled 2013-02-09 (×2): qty 125

## 2013-02-09 MED ORDER — IBUPROFEN 600 MG PO TABS
600.0000 mg | ORAL_TABLET | Freq: Four times a day (QID) | ORAL | Status: DC
  Administered 2013-02-09 – 2013-02-11 (×8): 600 mg via ORAL
  Filled 2013-02-09 (×9): qty 1

## 2013-02-09 MED ORDER — DIBUCAINE 1 % RE OINT: 1.0000 "application " | TOPICAL_OINTMENT | RECTAL | Status: DC | PRN

## 2013-02-09 MED ORDER — ONDANSETRON HCL 4 MG/2ML IJ SOLN: 4.0000 mg | Freq: Four times a day (QID) | INTRAMUSCULAR | Status: DC | PRN

## 2013-02-09 MED ORDER — PRENATAL MULTIVITAMIN CH
1.0000 | ORAL_TABLET | Freq: Every day | ORAL | Status: DC
  Administered 2013-02-09 – 2013-02-10 (×2): 1 via ORAL
  Filled 2013-02-09 (×2): qty 1

## 2013-02-09 MED ORDER — LACTATED RINGERS IV SOLN: 500.0000 mL | INTRAVENOUS | Status: DC | PRN

## 2013-02-09 MED ORDER — EPHEDRINE 5 MG/ML INJ
10.0000 mg | INTRAVENOUS | Status: DC | PRN
  Filled 2013-02-09: qty 4
  Filled 2013-02-09: qty 2
  Filled 2013-02-09: qty 4

## 2013-02-09 MED ORDER — FENTANYL CITRATE 0.05 MG/ML IJ SOLN
100.0000 ug | INTRAMUSCULAR | Status: DC | PRN
  Administered 2013-02-09 (×3): 100 ug via INTRAVENOUS
  Filled 2013-02-09 (×3): qty 2

## 2013-02-09 MED ORDER — LIDOCAINE HCL (PF) 1 % IJ SOLN
INTRAMUSCULAR | Status: DC | PRN
  Administered 2013-02-09 (×2): 4 mL

## 2013-02-09 MED ORDER — OXYTOCIN BOLUS FROM INFUSION
500.0000 mL | INTRAVENOUS | Status: DC
  Administered 2013-02-09: 500 mL via INTRAVENOUS

## 2013-02-09 MED ORDER — DIPHENHYDRAMINE HCL 50 MG/ML IJ SOLN: 12.5000 mg | INTRAMUSCULAR | Status: DC | PRN

## 2013-02-09 MED ORDER — CITRIC ACID-SODIUM CITRATE 334-500 MG/5ML PO SOLN: 30.0000 mL | ORAL | Status: DC | PRN

## 2013-02-09 MED ORDER — DIPHENHYDRAMINE HCL 25 MG PO CAPS: 25.0000 mg | ORAL_CAPSULE | Freq: Four times a day (QID) | ORAL | Status: DC | PRN

## 2013-02-09 MED ORDER — EPHEDRINE 5 MG/ML INJ
10.0000 mg | INTRAVENOUS | Status: DC | PRN
  Filled 2013-02-09: qty 2

## 2013-02-09 MED ORDER — SIMETHICONE 80 MG PO CHEW: 80.0000 mg | CHEWABLE_TABLET | ORAL | Status: DC | PRN

## 2013-02-09 MED ORDER — ZOLPIDEM TARTRATE 5 MG PO TABS: 5.0000 mg | ORAL_TABLET | Freq: Every evening | ORAL | Status: DC | PRN

## 2013-02-09 MED ORDER — SENNOSIDES-DOCUSATE SODIUM 8.6-50 MG PO TABS
2.0000 | ORAL_TABLET | Freq: Every day | ORAL | Status: DC
  Administered 2013-02-09 – 2013-02-10 (×2): 2 via ORAL

## 2013-02-09 MED ORDER — ONDANSETRON HCL 4 MG PO TABS: 4.0000 mg | ORAL_TABLET | ORAL | Status: DC | PRN

## 2013-02-09 NOTE — MAU Note (Signed)
Patient states she woke up around 2330 pm in a pool of fluid. At first she thought it was urine. Went to the restroom and urinated. After she finished more fluid came out. Irregular contractions.

## 2013-02-09 NOTE — Progress Notes (Signed)
Epidural canceled for now

## 2013-02-09 NOTE — Anesthesia Procedure Notes (Signed)
Epidural Patient location during procedure: OB Start time: 02/09/2013 4:27 AM  Staffing Anesthesiologist: Lexey Fletes A. Performed by: anesthesiologist   Preanesthetic Checklist Completed: patient identified, site marked, surgical consent, pre-op evaluation, timeout performed, IV checked, risks and benefits discussed and monitors and equipment checked  Epidural Patient position: sitting Prep: site prepped and draped and DuraPrep Patient monitoring: continuous pulse ox and blood pressure Approach: midline Injection technique: LOR air  Needle:  Needle type: Tuohy  Needle gauge: 17 G Needle length: 9 cm and 9 Needle insertion depth: 8 cm Catheter type: closed end flexible Catheter size: 19 Gauge Catheter at skin depth: 14 cm Test dose: negative and Other  Assessment Events: blood not aspirated, injection not painful, no injection resistance, negative IV test and no paresthesia  Additional Notes Patient identified. Risks and benefits discussed including failed block, incomplete  Pain control, post dural puncture headache, nerve damage, paralysis, blood pressure Changes, nausea, vomiting, reactions to medications-both toxic and allergic and post Partum back pain. All questions were answered. Patient expressed understanding and wished to proceed. Sterile technique was used throughout procedure. Epidural site was Dressed with sterile barrier dressing. No paresthesias, signs of intravascular injection Or signs of intrathecal spread were encountered.  Patient was more comfortable after the epidural was dosed. Please see RN's note for documentation of vital signs and FHR which are stable.

## 2013-02-09 NOTE — H&P (Signed)
Beth Calderon is a 27 y.o. female presenting for onset of labor with rupture of membranes. Per patient, woke up at 2330 in a pool of fluid that she initially thought was urine. Continued having fluid leakage after urinated. Reports irregular contractions, continued fetal movement, and no vaginal bleeding.  History OB History   Grav Para Term Preterm Abortions TAB SAB Ect Mult Living   1              Past Medical History  Diagnosis Date  . Anemia   . H/O seasonal allergies   h/o chlamydia, treated, with negative TOC 7/2.  Past Surgical History  Procedure Laterality Date  . No past surgeries     Family History: family history includes Cancer in her maternal grandmother; Diabetes in her maternal uncle; Hypertension in her father, maternal grandfather, maternal grandmother, maternal uncle, mother, paternal grandfather, and paternal grandmother; and Kidney disease in her maternal uncle. Social History:  reports that she has never smoked. She does not have any smokeless tobacco history on file. She reports that  drinks alcohol. She reports that she uses illicit drugs (Marijuana).   Prenatal Transfer Tool  Maternal Diabetes: No Genetic Screening: Declined/ Too late Maternal Ultrasounds/Referrals: Normal  (initially size>dates, repeat WNL) Fetal Ultrasounds or other Referrals:  None Maternal Substance Abuse:  No Significant Maternal Medications:  None Significant Maternal Lab Results:  Lab values include: Group B Strep negative Other Comments:  None  ROS - Per HPI  Dilation: 6.5 Effacement (%): 80 Station: -1 Exam by:: L. Munford RN, vertex Blood pressure 137/81, pulse 89, temperature 98.2 F (36.8 C), temperature source Oral, resp. rate 20, last menstrual period 05/03/2012. Maternal Exam:  Uterine Assessment: Contraction strength is moderate.  Contraction frequency is regular.   Abdomen: Fetal presentation: vertex  Introitus: Normal vulva. Ferning test: positive.  Amniotic  fluid character: meconium stained.  Pelvis: adequate for delivery.   Cervix: Cervix evaluated by digital exam.     Physical Exam  Constitutional: She appears well-developed and well-nourished.  HENT:  Head: Normocephalic and atraumatic.  Eyes: Conjunctivae and EOM are normal.  Neck: Normal range of motion. Neck supple.  Cardiovascular: Normal rate and regular rhythm.   No murmur heard. Respiratory: Effort normal and breath sounds normal. No stridor. No respiratory distress. She has no wheezes. She has no rales.  GI: There is no tenderness. There is no rebound.  Gravid   Musculoskeletal: Normal range of motion. She exhibits edema. She exhibits no tenderness.  Trace bilateral LE edema    Neurological: She is alert.  Skin: Skin is warm and dry. She is not diaphoretic.  Psychiatric: She has a normal mood and affect. Her behavior is normal.    FHT: 145bpm, moderate variability, accelerations present, no decelerations Toco: Contractions q2-5 minutes  Prenatal labs: ABO, Rh: A/POS/-- (06/18 0900) Antibody: NEG (06/18 0900) Rubella: 10.00 (06/18 0900) RPR: NON REAC (06/18 0900)  HBsAg: NEGATIVE (06/18 0900)  HIV: NON REACTIVE (06/18 0900)  GBS:   Negative 6/18  Assessment/Plan: 27 y.o. G1P0 at [redacted]w[redacted]d by LMP here with rupture of membranes in spontaneous onset of lbaor. Pregnancy complicated by late to prenatal care at [redacted]w[redacted]d. - Pain management with fentanyl IV q1hour prn - Anticipate NSVD - Abnormal pap - f/u postpartum - BP 130s systolic - no known h/o HTN - monitor - Feeding preference - breast  [name] 02/09/2013, 12:36 AM    I have seen and examined this patient and agree the above assessment.  CRESENZO-DISHMAN,Dilon Lank 02/09/2013 5:44 AM

## 2013-02-09 NOTE — Progress Notes (Signed)
SVD live female

## 2013-02-09 NOTE — Progress Notes (Addendum)
Beth Calderon is a 27 y.o. G1P0 at [redacted]w[redacted]d by LMP admitted for active labor, rupture of membranes  Subjective: Patient continuing to feel contractions and just requested another dose of analgesic.  Objective: BP 150/81  Pulse 85  Temp(Src) 98.5 F (36.9 C) (Oral)  Resp 22  Ht 5' 7.5" (1.715 m)  Wt 165.563 kg (365 lb)  BMI 56.29 kg/m2  LMP 05/03/2012      FHT:  FHR: 135 bpm, variability: moderate,  accelerations:  Present,  decelerations:  Present occasional shallow early deceleration UC:   regular, every 2-4 minutes SVE:   Dilation: 7-8 Effacement (%): 90-100 Station: +1 Exam by:: Thekkekandam  Labs: Lab Results  Component Value Date   WBC 9.7 02/09/2013   HGB 11.2* 02/09/2013   HCT 33.9* 02/09/2013   MCV 77.9* 02/09/2013   PLT 178 02/09/2013    Assessment / Plan: Spontaneous labor, progressing normally  Labor: Progressing normally Preeclampsia:  n/a Fetal Wellbeing:  Category I Pain Control:  Fentanyl I/D:  n/a Anticipated MOD:  NSVD Abnormal pap - f/u postpartum Feeding preference - breast BP - 130s systolic, with most recent 150 systolic during contraction- continue to monitor  [name] 02/09/2013, 2:37 AM

## 2013-02-09 NOTE — Clinical Social Work Maternal (Signed)
Clinical Social Work Department PSYCHOSOCIAL ASSESSMENT - MATERNAL/CHILD 02/09/2013  Patient:  Beth Calderon, Beth Calderon  Account Number:  [id number]  Admit Date:  02/08/2013  Marjo Bicker Name:   Marnee Guarneri    Clinical Social Worker:  Nobie Putnam, LCSW   Date/Time:  02/09/2013 04:30 PM  Date Referred:  02/09/2013   Referral source  CN     Referred reason  [facility]  Substance Abuse   Other referral source:    I:  FAMILY / HOME ENVIRONMENT Child's legal guardian:  PARENT  Guardian - Name Guardian - Age Guardian - Address  [address] [place]; [place], [place] [address]  Gladis Riffle 23 [place]   Other household support members/support persons Name Relationship DOB  Aleene Davidson MOTHER    Other support:    II  PSYCHOSOCIAL DATA Information Source:  Patient Interview  Event organiser Employment:   Financial resources:  Self Pay If [employer] - County:   Other  WIC   School / Grade:   Maternity Care Coordinator / Child Services Coordination / Early Interventions:  Cultural issues impacting care:    III  STRENGTHS Strengths  Adequate Resources  Home prepared for Child (including basic supplies)  Supportive family/friends   Strength comment:    IV  RISK FACTORS AND CURRENT PROBLEMS Current Problem:     Risk Factor & Current Problem Patient Issue Family Issue Risk Factor / Current Problem Comment  Other - See comment Y N LPNC  Substance Abuse Y N Hx of MJ use    V  SOCIAL WORK ASSESSMENT  CSW met with pt to assess reason for [facility] & substance use. Pt received pregnancy confirmation at 2 months.  She was living in [place] at the time & told CSW that she did not know where to go to seek [facility].  As a precaution, she would go to the hospital "every so months to get a health check," until she was 5 months pregnant.  During her 2nd trimester, a [facility] appointment was scheduled for her on 10/13/12.  Pt told CSW that she wasn't able to attend that  appointment because she was living in a homeless shelter at that time & they sent her back to the area on 10/10/12.  Pt told CSW that [place]12[place]March '14.  According to the pt, she was a "stubborn 27 years old," who wanted to live on her own.  Pt describes the relationship with her mother as good & her housing situation as "very stable now."  When pt relocated to the area, she applied for Medicaid & had to wait to get insurance benefits.  Once [facility] was established, she attended all appointments regularly.  Pt admits to smoking MJ until the end of the 1st trimester.  She estimates that she smoked "once or twice a day" to help with nausea.  She denies other illegal substance use & verbalized understanding of hospital drug testing policy.  UDS & meconium collection pending.  She reports having all the necessary supplies for the infant. FOB was at the bedside sleeping.  He arrived her yesterday from [place].  Pt denies any history of depression or SI.  CSW had a difficult time understanding pt's explanation of history however she was able to answer questions appropriately.  CSW will monitor drug screen results & assist family further if needed.    VI SOCIAL WORK PLAN Social Work Plan  No Further Intervention Required / No Barriers to Discharge   Type of pt/family education:   If child protective services report - county:   If child protective services report - date:   Information/referral to community resources comment:   Other social work plan:

## 2013-02-09 NOTE — Progress Notes (Signed)
Requests epidural.

## 2013-02-09 NOTE — Anesthesia Postprocedure Evaluation (Signed)
  Anesthesia Post-op Note  Patient: Beth Calderon  Procedure(s) Performed: * No procedures listed *  Patient Location: PACU and Mother/Baby  Anesthesia Type:Epidural  Level of Consciousness: awake, alert  and oriented  Airway and Oxygen Therapy: Patient Spontanous Breathing  Post-op Pain: mild  Post-op Assessment: Patient's Cardiovascular Status Stable, Respiratory Function Stable, No signs of Nausea or vomiting, Adequate PO intake and Pain level controlled. No residual numbness or headache  Post-op Vital Signs: stable  Complications: No apparent anesthesia complications

## 2013-02-09 NOTE — Progress Notes (Signed)
Admitted to rm 167

## 2013-02-09 NOTE — Anesthesia Preprocedure Evaluation (Addendum)
Anesthesia Evaluation  Patient identified by MRN, date of birth, ID band Patient awake    Reviewed: Allergy & Precautions, H&P , Patient's Chart, lab work & pertinent test results  Airway Mallampati: III TM Distance: >3 FB Neck ROM: full    Dental no notable dental hx. (+) Teeth Intact   Pulmonary neg pulmonary ROS,  breath sounds clear to auscultation  Pulmonary exam normal       Cardiovascular negative cardio ROS  Rhythm:regular Rate:Normal     Neuro/Psych negative neurological ROS  negative psych ROS   GI/Hepatic negative GI ROS, Neg liver ROS,   Endo/Other  Morbid obesity  Renal/GU negative Renal ROS  negative genitourinary   Musculoskeletal   Abdominal (+) + obese,   Peds  Hematology negative hematology ROS (+)   Anesthesia Other Findings   Reproductive/Obstetrics (+) Pregnancy                          Anesthesia Physical Anesthesia Plan  ASA: III  Anesthesia Plan: Epidural   Post-op Pain Management:    Induction:   Airway Management Planned: Natural Airway  Additional Equipment:   Intra-op Plan:   Post-operative Plan:   Informed Consent: I have reviewed the patients History and Physical, chart, labs and discussed the procedure including the risks, benefits and alternatives for the proposed anesthesia with the patient or authorized representative who has indicated his/her understanding and acceptance.   Dental advisory given  Plan Discussed with: Anesthesiologist  Anesthesia Plan Comments: (Patient declined epidural as she was fully dilated and ready to push. )      Anesthesia Quick Evaluation

## 2013-02-09 NOTE — Progress Notes (Signed)
Called to come for deliviery

## 2013-02-09 NOTE — Progress Notes (Signed)
UR chart review completed.  

## 2013-02-10 NOTE — Progress Notes (Signed)
Post Partum Day 1 Subjective: no complaints, up ad lib, voiding, tolerating PO, + flatus and breastfeeding is going well  Objective: Blood pressure 114/80, pulse 68, temperature 97.5 F (36.4 C), temperature source Oral, resp. rate 20, height 5' 7.5" (1.715 m), weight 165.563 kg (365 lb), last menstrual period 05/03/2012, unknown if currently breastfeeding.  Physical Exam:  General: alert, cooperative, appears stated age and no distress Lochia: appropriate Uterine Fundus: firm though exam limited by obesity DVT Evaluation: No evidence of DVT seen on physical exam. No cords or calf tenderness. No significant calf/ankle edema. RRR by bilateral 2+ radial pulse Normal respiratory effort  Recent Labs  02/09/13 0045  HGB 11.2*  HCT 33.9*    Assessment/Plan: Plan for discharge tomorrow, Breastfeeding and Contraception unsure. SW consulted due to limited prenatal care and substance abuse, with h/o homelessness, however deemed no barrier to discharge with current stable living situation per pt. Abnormal pap - follow up postpartum. Breastfeeding BP stable.   LOS: 2 days   [name] 02/10/2013, 6:43 AM   See also by me Agree with note Wynelle Bourgeois CNM

## 2013-02-11 MED ORDER — IBUPROFEN 600 MG PO TABS: 600.0000 mg | ORAL_TABLET | Freq: Four times a day (QID) | ORAL | Status: DC

## 2013-02-11 NOTE — Discharge Summary (Signed)
Obstetric Discharge Summary Reason for Admission: onset of labor and rupture of membranes Prenatal Procedures: none Intrapartum Procedures: spontaneous vaginal delivery Postpartum Procedures: none Complications-Operative and Postpartum: vaginal laceration, first degree   Hemoglobin  Date Value Range Status  02/09/2013 11.2* 12.0 - 15.0 g/dL Final     HCT  Date Value Range Status  02/09/2013 33.9* 36.0 - 46.0 % Final    Physical Exam:  General: alert, cooperative and no distress Lungs: nl effort Heart: RRR Lochia: appropriate Uterine Fundus: firm DVT Evaluation: No evidence of DVT seen on physical exam.  Discharge Diagnoses: Term Pregnancy-delivered  Discharge Information: Date: 02/11/2013 Activity: pelvic rest Diet: routine Medications: PNV and Ibuprofen Condition: stable Instructions: refer to practice specific booklet Discharge to: home Follow-up Information   Follow up with [facility]. Call in 6 weeks. (Postpartum care )    Contact information:   [address] [place] [address] [phone]      Newborn Data: Live born female  Birth Weight: 6 lb 7 oz (2920 g) APGAR: 8, 9  Home with mother. Breastfeeding; will review contraception at [facility] visit.  Chrissie Noa 02/11/2013, 7:30 AM  I have seen and examined this patient and I agree with the above. Cam Hai 7:46 AM 02/11/2013

## 2013-02-11 NOTE — Lactation Note (Signed)
This note was copied from the chart of Girl Nataliee Shurtz. Lactation Consultation Note  Patient Name: Girl Chantavia Bazzle [id number] Date: 02/11/2013 Reason for consult: Follow-up assessment   Maternal Data    Feeding Feeding Type: Breast Milk Feeding method: Breast  LATCH Score/Interventions Latch: Grasps breast easily, tongue down, lips flanged, rhythmical sucking.  Audible Swallowing: A few with stimulation  Type of Nipple: Everted at rest and after stimulation  Comfort (Breast/Nipple): Soft / non-tender     Hold (Positioning): No assistance needed to correctly position infant at breast.  LATCH Score: 9  Lactation Tools Discussed/Used     Consult Status Consult Status: Complete  Mom had baby latched to breast when I went in. Reports that baby has been nursing a lot through the night. Reassurance given. Mom doing well with positioning baby in football hold. Reports that left nipple is slightly sore because of a bad latch earlier. Reports that it is getting better. No questions at present. To call prn.  Pamelia Hoit 02/11/2013, 10:03 AM

## 2013-03-28 ENCOUNTER — Ambulatory Visit: Payer: Medicaid Other | Admitting: Obstetrics & Gynecology

## 2013-05-09 ENCOUNTER — Ambulatory Visit (INDEPENDENT_AMBULATORY_CARE_PROVIDER_SITE_OTHER): Payer: Medicaid Other | Admitting: Obstetrics and Gynecology

## 2013-05-09 ENCOUNTER — Other Ambulatory Visit (HOSPITAL_COMMUNITY)
Admission: RE | Admit: 2013-05-09 | Discharge: 2013-05-09 | Disposition: A | Discharge status: Home / Self Care | Payer: Medicaid Other | Source: Ambulatory Visit | Attending: Obstetrics and Gynecology | Admitting: Obstetrics and Gynecology

## 2013-05-09 DIAGNOSIS — Z01419 Encounter for gynecological examination (general) (routine) without abnormal findings: Secondary | ICD-10-CM | POA: Insufficient documentation

## 2013-05-09 NOTE — Progress Notes (Signed)
  Subjective:     Beth Calderon is a 27 y.o. female who presents for a postpartum visit. She is 3 months postpartum following a spontaneous vaginal delivery. I have fully reviewed the prenatal and intrapartum course. The delivery was at 40.2 gestational weeks. Outcome: spontaneous vaginal delivery. Anesthesia: epidural. Postpartum course has been uncomplicated. Baby's course has been uncomplicated. Baby is feeding by formula. Bleeding normal period. Bowel function is normal. Bladder function is normal. Patient is sexually active. Contraception method is condoms. Postpartum depression screening: negative.     Review of Systems A comprehensive review of systems was negative.   Objective:    BP 157/107  Pulse 81  Temp(Src) 97 F (36.1 C) (Oral)  Ht 5\' 7"  (1.702 m)  Wt 351 lb (159.213 kg)  BMI 54.96 kg/m2  Breastfeeding? Yes  General:  alert, cooperative and no distress   Breasts:  inspection negative, no nipple discharge or bleeding, no masses or nodularity palpable  Lungs: clear to auscultation bilaterally  Heart:  regular rate and rhythm  Abdomen: soft, non-tender; bowel sounds normal; no masses,  no organomegaly   Vulva:  normal  Vagina: normal vagina, no discharge, exudate, lesion, or erythema  Cervix:  multiparous appearance  Corpus: normal size, contour, position, consistency, mobility, non-tender  Adnexa:  normal adnexa  Rectal Exam: Not performed.        Assessment:     Normal postpartum exam. Pap smear done at today's visit secondary to h/o LGSIL in early pregnancy.   Plan:    1. Contraception: condoms 2. Patient will return in a few days for depo-Provera 3. Follow up in: 1 year or as needed.

## 2013-05-18 ENCOUNTER — Ambulatory Visit: Payer: Medicaid Other

## 2013-05-20 ENCOUNTER — Ambulatory Visit: Payer: Medicaid Other

## 2013-05-26 ENCOUNTER — Ambulatory Visit: Payer: Medicaid Other

## 2013-06-01 ENCOUNTER — Ambulatory Visit: Payer: Medicaid Other

## 2013-08-04 NOTE — L&D Delivery Note (Signed)
Darcel SmallingMonique M Cisnero is a 28 y.o. G2P1001 at 6554w0d admitted for active labor with di/di twins  Delivery Note  Arrived to patient's room after epidural placement and she reported pelvic pressure. Exam confirmed that she was fully dilated, +3 station. Given transverse presentation of Twin B, the decision as made to deliver in the OR. Patient was consented for cesarean section in the event that this would be needed.  All questions answered.  Upon arrival to the OR, she was moved to the OR bed and placed in the dorsal lithotomy position.  She proceeded to push and delivery of Twin A occurred after two pushes.    Marya LandryHeadley, BoyA CannonsburgMonique [[id number]][[id number]]  At 11:22 AM a viable female was delivered via Spontaneous Vaginal Delivery (Presentation: Cephalic).  APGAR: 9, 9; weight 6 lb 11 oz (3033 g).   Placenta status: Intact, Spontaneous, 3VC  Cord:  One loose body cord, reduced without difficulty.   After delivery of Twin B, bedside ultrasound showed that Twin B had rotated to cephalic presentation. However, the FHR became difficult to ascertain given maternal habitus; the membranes were ruptured and FSE was placed.  The FHR was initially reassuring but was then noted to be in the 80s-100s; fetal station was noted to be 0. The patient was able to push to +2 station and a Kiwi vacuum was placed to aid in rest of delivery. This was in place over two pushes; had one pop-off. Patient was verbally consented prior to vacuum placement.     Allena EaringHeadley, GirlB Jesenia [[id number]][[id number]]  At 11:35 AM a viable female was delivered via  Vacuum Assisted Vaginal Delivery (Presentation: Transverse -> Cephalic).  APGAR: 9, 9; weight 5 lb 13 oz (2637 g).   Placenta status: Intact, Spontaneous, 3VC.  Cord:  with the following complications: One nuchal cord, reduced without difficulty.   Anesthesia: Epidural  Episiotomy: None Lacerations: 1st degree Suture Repair: 3.0 vicryl Est. Blood Loss (mL): 300   Mom to postpartum.   Baby A to  Couplet care / Skin to Skin.   Baby B to Couplet care / Skin to Skin.    Jaynie CollinsUGONNA  Susen Haskew, MD, FACOG Attending Obstetrician & Gynecologist Faculty Practice, [facility]

## 2013-08-22 ENCOUNTER — Ambulatory Visit: Payer: Medicaid Other

## 2013-08-23 ENCOUNTER — Encounter (HOSPITAL_COMMUNITY): Payer: Self-pay | Admitting: Emergency Medicine

## 2013-08-23 ENCOUNTER — Emergency Department (HOSPITAL_COMMUNITY)
Admission: EM | Admit: 2013-08-23 | Discharge: 2013-08-23 | Disposition: A | Discharge status: Home / Self Care | Payer: Medicaid Other | Attending: Emergency Medicine | Admitting: Emergency Medicine

## 2013-08-23 DIAGNOSIS — R42 Dizziness and giddiness: Secondary | ICD-10-CM | POA: Insufficient documentation

## 2013-08-23 NOTE — ED Notes (Signed)
Pt called to move back and no answer
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
## 2013-08-24 ENCOUNTER — Ambulatory Visit: Payer: Medicaid Other

## 2013-12-21 ENCOUNTER — Ambulatory Visit (INDEPENDENT_AMBULATORY_CARE_PROVIDER_SITE_OTHER): Payer: Medicaid Other | Admitting: *Deleted

## 2013-12-21 DIAGNOSIS — O0930 Supervision of pregnancy with insufficient antenatal care, unspecified trimester: Secondary | ICD-10-CM

## 2013-12-21 DIAGNOSIS — Z3201 Encounter for pregnancy test, result positive: Secondary | ICD-10-CM

## 2013-12-21 LAB — POCT PREGNANCY, URINE: Preg Test, Ur: POSITIVE — AB

## 2013-12-21 NOTE — Progress Notes (Signed)
Pt here for urine pregnancy test- positive result. She states LMP 07/06/13 which would make her 24 wks now with EDD 04/11/14. She desires to receive prenatal care from this office.  US for anatomy scheduled 5/29 @ 1300.

## 2013-12-22 LAB — POCT PREGNANCY, URINE: Preg Test, Ur: POSITIVE — AB

## 2013-12-30 ENCOUNTER — Ambulatory Visit (HOSPITAL_COMMUNITY)
Admission: RE | Admit: 2013-12-30 | Discharge: 2013-12-30 | Disposition: A | Discharge status: Home / Self Care | Payer: Medicaid Other | Source: Ambulatory Visit | Attending: Obstetrics & Gynecology | Admitting: Obstetrics & Gynecology

## 2013-12-30 ENCOUNTER — Other Ambulatory Visit: Payer: Self-pay | Admitting: Obstetrics & Gynecology

## 2013-12-30 DIAGNOSIS — O0930 Supervision of pregnancy with insufficient antenatal care, unspecified trimester: Secondary | ICD-10-CM | POA: Diagnosis not present

## 2013-12-30 DIAGNOSIS — Z3689 Encounter for other specified antenatal screening: Secondary | ICD-10-CM | POA: Diagnosis present

## 2014-01-09 ENCOUNTER — Ambulatory Visit (INDEPENDENT_AMBULATORY_CARE_PROVIDER_SITE_OTHER): Payer: Medicaid Other | Admitting: Obstetrics and Gynecology

## 2014-01-09 ENCOUNTER — Encounter: Payer: Self-pay | Admitting: Obstetrics and Gynecology

## 2014-01-09 VITALS — BP 127/80 | HR 98 | Wt 398.0 lb

## 2014-01-09 DIAGNOSIS — O0930 Supervision of pregnancy with insufficient antenatal care, unspecified trimester: Secondary | ICD-10-CM

## 2014-01-09 DIAGNOSIS — O30009 Twin pregnancy, unspecified number of placenta and unspecified number of amniotic sacs, unspecified trimester: Secondary | ICD-10-CM

## 2014-01-09 DIAGNOSIS — O0933 Supervision of pregnancy with insufficient antenatal care, third trimester: Secondary | ICD-10-CM

## 2014-01-09 DIAGNOSIS — O30049 Twin pregnancy, dichorionic/diamniotic, unspecified trimester: Secondary | ICD-10-CM

## 2014-01-09 DIAGNOSIS — O30002 Twin pregnancy, unspecified number of placenta and unspecified number of amniotic sacs, second trimester: Secondary | ICD-10-CM | POA: Insufficient documentation

## 2014-01-09 LAB — POCT URINALYSIS DIP (DEVICE)
Bilirubin Urine: NEGATIVE
GLUCOSE, UA: NEGATIVE mg/dL
Hgb urine dipstick: NEGATIVE
Ketones, ur: NEGATIVE mg/dL
LEUKOCYTES UA: NEGATIVE
Nitrite: NEGATIVE
PROTEIN: 100 mg/dL — AB
Specific Gravity, Urine: >=1.03 (ref 1.005–1.030)
UROBILINOGEN UA: 0.2 mg/dL (ref 0.0–1.0)
pH: 6.5 (ref 5.0–8.0)

## 2014-01-09 LAB — OB RESULTS CONSOLE GC/CHLAMYDIA
CHLAMYDIA, DNA PROBE: NEGATIVE
Gonorrhea: NEGATIVE

## 2014-01-09 MED ORDER — PRENATAL PLUS 27-1 MG PO TABS: 1.0000 | ORAL_TABLET | Freq: Every day | ORAL | Status: DC

## 2014-01-09 NOTE — Patient Instructions (Addendum)
Second Trimester of Pregnancy The second trimester is from week 13 through week 28, months 4 through 6. The second trimester is often a time when you feel your best. Your body has also adjusted to being pregnant, and you begin to feel better physically. Usually, morning sickness has lessened or quit completely, you may have more energy, and you may have an increase in appetite. The second trimester is also a time when the fetus is growing rapidly. At the end of the sixth month, the fetus is about 9 inches long and weighs about 1 pounds. You will likely begin to feel the baby move (quickening) between 18 and 20 weeks of the pregnancy. BODY CHANGES Your body goes through many changes during pregnancy. The changes vary from woman to woman.   Your weight will continue to increase. You will notice your lower abdomen bulging out.  You may begin to get stretch marks on your hips, abdomen, and breasts.  You may develop headaches that can be relieved by medicines approved by your caregiver.  You may urinate more often because the fetus is pressing on your bladder.  You may develop or continue to have heartburn as a result of your pregnancy.  You may develop constipation because certain hormones are causing the muscles that push waste through your intestines to slow down.  You may develop hemorrhoids or swollen, bulging veins (varicose veins).  You may have back pain because of the weight gain and pregnancy hormones relaxing your joints between the bones in your pelvis and as a result of a shift in weight and the muscles that support your balance.  Your breasts will continue to grow and be tender.  Your gums may bleed and may be sensitive to brushing and flossing.  Dark spots or blotches (chloasma, mask of pregnancy) may develop on your face. This will likely fade after the baby is born.  A dark line from your belly button to the pubic area (linea nigra) may appear. This will likely fade after the  baby is born. WHAT TO EXPECT AT YOUR PRENATAL VISITS During a routine prenatal visit:  You will be weighed to make sure you and the fetus are growing normally.  Your blood pressure will be taken.  Your abdomen will be measured to track your baby's growth.  The fetal heartbeat will be listened to.  Any test results from the previous visit will be discussed. Your caregiver may ask you:  How you are feeling.  If you are feeling the baby move.  If you have had any abnormal symptoms, such as leaking fluid, bleeding, severe headaches, or abdominal cramping.  If you have any questions. Other tests that may be performed during your second trimester include:  Blood tests that check for:  Low iron levels (anemia).  Gestational diabetes (between 24 and 28 weeks).  Rh antibodies.  Urine tests to check for infections, diabetes, or protein in the urine.  An ultrasound to confirm the proper growth and development of the baby.  An amniocentesis to check for possible genetic problems.  Fetal screens for spina bifida and Down syndrome. HOME CARE INSTRUCTIONS   Avoid all smoking, herbs, alcohol, and unprescribed drugs. These chemicals affect the formation and growth of the baby.  Follow your caregiver's instructions regarding medicine use. There are medicines that are either safe or unsafe to take during pregnancy.  Exercise only as directed by your caregiver. Experiencing uterine cramps is a good sign to stop exercising.  Continue to eat regular,   healthy meals.  Wear a good support bra for breast tenderness.  Do not use hot tubs, steam rooms, or saunas.  Wear your seat belt at all times when driving.  Avoid raw meat, uncooked cheese, cat litter boxes, and soil used by cats. These carry germs that can cause birth defects in the baby.  Take your prenatal vitamins.  Try taking a stool softener (if your caregiver approves) if you develop constipation. Eat more high-fiber foods,  such as fresh vegetables or fruit and whole grains. Drink plenty of fluids to keep your urine clear or pale yellow.  Take warm sitz baths to soothe any pain or discomfort caused by hemorrhoids. Use hemorrhoid cream if your caregiver approves.  If you develop varicose veins, wear support hose. Elevate your feet for 15 minutes, 3 4 times a day. Limit salt in your diet.  Avoid heavy lifting, wear low heel shoes, and practice good posture.  Rest with your legs elevated if you have leg cramps or low back pain.  Visit your dentist if you have not gone yet during your pregnancy. Use a soft toothbrush to brush your teeth and be gentle when you floss.  A sexual relationship may be continued unless your caregiver directs you otherwise.  Continue to go to all your prenatal visits as directed by your caregiver. SEEK MEDICAL CARE IF:   You have dizziness.  You have mild pelvic cramps, pelvic pressure, or nagging pain in the abdominal area.  You have persistent nausea, vomiting, or diarrhea.  You have a bad smelling vaginal discharge.  You have pain with urination. SEEK IMMEDIATE MEDICAL CARE IF:   You have a fever.  You are leaking fluid from your vagina.  You have spotting or bleeding from your vagina.  You have severe abdominal cramping or pain.  You have rapid weight gain or loss.  You have shortness of breath with chest pain.  You notice sudden or extreme swelling of your face, hands, ankles, feet, or legs.  You have not felt your baby move in over an hour.  You have severe headaches that do not go away with medicine.  You have vision changes. Document Released: 07/15/2001 Document Revised: 03/23/2013 Document Reviewed: 09/21/2012 [facility] Patient Information 2014 [place], [place]. Breastfeeding, Twins or Multiples Mothers of twins or multiples might feel overwhelmed with the idea of breastfeeding more than one baby at a time. It is easier and less expensive to breastfeed  twins than to bottle feed them. This is because you do not need to buy infant formula, wash bottles, buy mild soap, and fill the bottles for more than one baby when it is time to feed them. Human milk is especially important for twins, who are often small at birth and need all the advantages breast milk can provide. Breastfeeding also helps create a unique and special bond between the mother and each of her infants.  Mothers of multiples get more benefits from breastfeeding:  Your uterus contracts and returns to its original size faster. This is helpful because it has stretched even more than normal to hold more than one baby.  Hormones are released that relax the mother. This is helpful with the added stress of caring for more than one infant.  The mother often finds she is saving herself time and money, because there is no need to prepare formula or bottles. Your milk is available whenever your babies are ready to feed, at the right temperature, providing optimal nutrition. If the babies are premature  and unable to nurse, you can pump your breasts and freeze the milk until the babies are ready to feed at the breast. To stimulate a milk supply, your breasts need to be emptied at least 8 to 10 times in a 24 hour period. Ask a lactation specialist to help you choose an effective breast pump and to provide guidance in helping your babies latch onto, and feed from, the breast when they are ready. TO GET STARTED: Nurse as soon as possible after birth, and as often as the babies want to do so. This will stimulate your breasts to produce adequate amounts of milk. Mothers of twins almost always produce enough milk for both babies.  TIPS TO INCREASE SUCCESS:  Many mothers of multiples find it easiest to nurse the babies together. However, if one of the infants is having difficulty latching or sucking, the mother may need to give that baby her full attention when it is time to feed.  Nursing two babies at the  same time often gets easier as the babies get older and more experienced at latching onto the breast. Extra pillows under the mother's arms, legs, and under the babies can help this process.  Breastfeeding two babies at once may increase the mother's milk producing hormone (prolactin) levels, and boost her milk production. The more often the babies breastfeed effectively, the more milk the mother will produce.  Switch the babies from one side to the other at alternate feedings. For instance, if baby A feeds from the right breast and baby B feeds from the left breast, then at the next feeding, baby A should take the left breast and baby B the right breast. This ensures that both breasts get equal amounts of stimulation. It also uses the stronger sucking twin to increase the milk supply for the twin whose suck is weaker.  If one of the babies is having difficulty feeding, it may help to try breastfeeding him at the same time as his sibling. The baby with the stronger or more effective suck will stimulate the mother's milk to flow faster. This will encourage his twin to suck and swallow correctly.  It is important to avoid limiting the amount of time each baby spends feeding at the breast. This allows both babies to obtain the fattier milk that is available at the end of the feeding, when the breast is emptier.  Avoiding bottles and pacifiers during the early weeks will encourage effective sucking patterns and help establish a good milk supply. You should not need supplements if you empty your breasts with each feeding.  A good latch for both infants is important in helping the babies empty the breast effectively, and for avoiding sore nipples. The most common cause of soreness is improper latch-on and positioning.  In the early days, keep track of each infant's stools and wet diapers, to make sure each baby is getting enough milk. In the first 6 weeks, each baby should have 6 to 8 wet cloth diapers (5 to  6 disposable diapers) and 2 or more bowel movements per day.  There are several positions and holds that make it easier to nurse more than one baby at a time. Ask your nurse or lactation specialist to suggest tips on positioning.  If you know you are having twins, talk with a lactation consultant about more ways you can increase your success at breastfeeding. Document Released: 11/18/2004 Document Revised: 10/13/2011 Document Reviewed: 05/02/2013 [facility] Patient Information 2014 [place], [place]. Second Trimester of  Pregnancy The second trimester is from week 13 through week 28, months 4 through 6. The second trimester is often a time when you feel your best. Your body has also adjusted to being pregnant, and you begin to feel better physically. Usually, morning sickness has lessened or quit completely, you may have more energy, and you may have an increase in appetite. The second trimester is also a time when the fetus is growing rapidly. At the end of the sixth month, the fetus is about 9 inches long and weighs about 1 pounds. You will likely begin to feel the baby move (quickening) between 18 and 20 weeks of the pregnancy. BODY CHANGES Your body goes through many changes during pregnancy. The changes vary from woman to woman.   Your weight will continue to increase. You will notice your lower abdomen bulging out.  You may begin to get stretch marks on your hips, abdomen, and breasts.  You may develop headaches that can be relieved by medicines approved by your caregiver.  You may urinate more often because the fetus is pressing on your bladder.  You may develop or continue to have heartburn as a result of your pregnancy.  You may develop constipation because certain hormones are causing the muscles that push waste through your intestines to slow down.  You may develop hemorrhoids or swollen, bulging veins (varicose veins).  You may have back pain because of the weight gain and pregnancy  hormones relaxing your joints between the bones in your pelvis and as a result of a shift in weight and the muscles that support your balance.  Your breasts will continue to grow and be tender.  Your gums may bleed and may be sensitive to brushing and flossing.  Dark spots or blotches (chloasma, mask of pregnancy) may develop on your face. This will likely fade after the baby is born.  A dark line from your belly button to the pubic area (linea nigra) may appear. This will likely fade after the baby is born. WHAT TO EXPECT AT YOUR PRENATAL VISITS During a routine prenatal visit:  You will be weighed to make sure you and the fetus are growing normally.  Your blood pressure will be taken.  Your abdomen will be measured to track your baby's growth.  The fetal heartbeat will be listened to.  Any test results from the previous visit will be discussed. Your caregiver may ask you:  How you are feeling.  If you are feeling the baby move.  If you have had any abnormal symptoms, such as leaking fluid, bleeding, severe headaches, or abdominal cramping.  If you have any questions. Other tests that may be performed during your second trimester include:  Blood tests that check for:  Low iron levels (anemia).  Gestational diabetes (between 24 and 28 weeks).  Rh antibodies.  Urine tests to check for infections, diabetes, or protein in the urine.  An ultrasound to confirm the proper growth and development of the baby.  An amniocentesis to check for possible genetic problems.  Fetal screens for spina bifida and Down syndrome. HOME CARE INSTRUCTIONS   Avoid all smoking, herbs, alcohol, and unprescribed drugs. These chemicals affect the formation and growth of the baby.  Follow your caregiver's instructions regarding medicine use. There are medicines that are either safe or unsafe to take during pregnancy.  Exercise only as directed by your caregiver. Experiencing uterine cramps is a  good sign to stop exercising.  Continue to eat regular, healthy meals.  Wear a good support bra for breast tenderness.  Do not use hot tubs, steam rooms, or saunas.  Wear your seat belt at all times when driving.  Avoid raw meat, uncooked cheese, cat litter boxes, and soil used by cats. These carry germs that can cause birth defects in the baby.  Take your prenatal vitamins.  Try taking a stool softener (if your caregiver approves) if you develop constipation. Eat more high-fiber foods, such as fresh vegetables or fruit and whole grains. Drink plenty of fluids to keep your urine clear or pale yellow.  Take warm sitz baths to soothe any pain or discomfort caused by hemorrhoids. Use hemorrhoid cream if your caregiver approves.  If you develop varicose veins, wear support hose. Elevate your feet for 15 minutes, 3 4 times a day. Limit salt in your diet.  Avoid heavy lifting, wear low heel shoes, and practice good posture.  Rest with your legs elevated if you have leg cramps or low back pain.  Visit your dentist if you have not gone yet during your pregnancy. Use a soft toothbrush to brush your teeth and be gentle when you floss.  A sexual relationship may be continued unless your caregiver directs you otherwise.  Continue to go to all your prenatal visits as directed by your caregiver. SEEK MEDICAL CARE IF:   You have dizziness.  You have mild pelvic cramps, pelvic pressure, or nagging pain in the abdominal area.  You have persistent nausea, vomiting, or diarrhea.  You have a bad smelling vaginal discharge.  You have pain with urination. SEEK IMMEDIATE MEDICAL CARE IF:   You have a fever.  You are leaking fluid from your vagina.  You have spotting or bleeding from your vagina.  You have severe abdominal cramping or pain.  You have rapid weight gain or loss.  You have shortness of breath with chest pain.  You notice sudden or extreme swelling of your face, hands,  ankles, feet, or legs.  You have not felt your baby move in over an hour.  You have severe headaches that do not go away with medicine.  You have vision changes. Document Released: 07/15/2001 Document Revised: 03/23/2013 Document Reviewed: 09/21/2012 [facility] Patient Information 2014 [place], [place].

## 2014-01-09 NOTE — Progress Notes (Signed)
Subjective:    TANAI SCAVONE is a G2P1001 [redacted]w[redacted]d being seen today for her first obstetrical visit.  Her obstetrical history is significant for obesity and abnormal Pap, Di/di twins this gestation. Patient does intend to breast feed. Pregnancy history fully reviewed.  Patient reports discomforts of RLP, SOB at times. Still adjusting to the idea of twin pregnancy and having a 26 yo. Was planning termination, now is accepting. . Good upper and lower abdominal FM. Aware of abnormal Pap and POC for F/U PP.   Filed Vitals:   01/09/14 1427  BP: 127/80  Pulse: 98  Weight: 398 lb (180.532 kg)    HISTORY: OB History  Gravida Para Term Preterm AB SAB TAB Ectopic Multiple Living  2 1 1       1     # Outcome Date GA Lbr Len/2nd Weight Sex Delivery Anes PTL Lv  2 CUR           1 TRM 02/09/13 [redacted]w[redacted]d 04:08 / 01:17 6 lb 7 oz (2.92 kg) F SVD EPI  Y     Past Medical History  Diagnosis Date  . Anemia   . H/O seasonal allergies    Past Surgical History  Procedure Laterality Date  . No past surgeries     Family History  Problem Relation Age of Onset  . Hypertension Mother   . Hypertension Father   . Hypertension Maternal Uncle   . Kidney disease Maternal Uncle   . Diabetes Maternal Uncle   . Hypertension Maternal Grandmother   . Cancer Maternal Grandmother   . Hypertension Maternal Grandfather   . Hypertension Paternal Grandmother   . Hypertension Paternal Grandfather      Exam    Uterus:   42 cm, FHR A 160, B 152  Pelvic Exam:    Perineum: No Hemorrhoids, Normal Perineum   Vulva: normal, female escutcheon   Vagina:  normal mucosa, normal discharge       Cervix: no lesions and L/C/H   Adnexa: not evaluated   Bony Pelvis: average  System: Breast:  normal appearance, no masses or tenderness   Skin: normal coloration and turgor, no rashes    Neurologic: oriented, normal, grossly non-focal   Extremities: normal strength, tone, and muscle mass   HEENT PERRLA and extra ocular  movement intact   Mouth/Teeth mucous membranes moist, pharynx normal without lesions and dental hygiene good   Neck supple and no masses   Cardiovascular: regular rate and rhythm, no murmurs or gallops   Respiratory:  appears well, vitals normal, no respiratory distress, acyanotic, normal RR, ear and throat exam is normal, neck free of mass or lymphadenopathy, chest clear, no wheezing, crepitations, rhonchi, normal symmetric air entry   Abdomen: soft, non-tender; bowel sounds normal; no masses,  no organomegaly and morbidly obese, term size (twins)   Urinary: urethral meatus normal      Assessment:    Pregnancy: G2P1001 Patient Active Problem List   Diagnosis Date Noted  . Twin pregnancy in second trimester 01/09/2014  . Abnormal Pap smear 02/01/2013  . Insufficient prenatal care in third trimester 01/19/2013  . Chlamydia infection complicating pregnancy in third trimester 01/19/2013  . Morbid obesity 01/19/2013        Plan:     Initial labs drawn. Prenatal vitamins. Problem list reviewed and updated. Genetic Screening discussed Quad Screen: too late.  Ultrasound ordered. F/U growth scan  Follow up in 3 weeks at [facility] 30% of 30 min visit spent on counseling  and coordination of care.  TOC CT sent 1 hr OGTT done PTL s/sx reviewed   Kaylon Laroche C Taeya Theall 01/09/2014

## 2014-01-09 NOTE — Progress Notes (Signed)
Pregnant with twins. One boy and one girl.  New Ob Packet given. Medicaid Home Form Completed.  Had pap in November. GC/CH on Urine.

## 2014-01-10 LAB — OBSTETRIC PANEL
ANTIBODY SCREEN: NEGATIVE
BASOS ABS: 0 10*3/uL (ref 0.0–0.1)
BASOS PCT: 0 % (ref 0–1)
EOS ABS: 0.1 10*3/uL (ref 0.0–0.7)
EOS PCT: 1 % (ref 0–5)
HEMATOCRIT: 30.4 % — AB (ref 36.0–46.0)
Hemoglobin: 10.2 g/dL — ABNORMAL LOW (ref 12.0–15.0)
Hepatitis B Surface Ag: NEGATIVE
Lymphocytes Relative: 20 % (ref 12–46)
Lymphs Abs: 1.8 10*3/uL (ref 0.7–4.0)
MCH: 26.4 pg (ref 26.0–34.0)
MCHC: 33.6 g/dL (ref 30.0–36.0)
MCV: 78.6 fL (ref 78.0–100.0)
MONO ABS: 0.5 10*3/uL (ref 0.1–1.0)
Monocytes Relative: 6 % (ref 3–12)
Neutro Abs: 6.4 10*3/uL (ref 1.7–7.7)
Neutrophils Relative %: 73 % (ref 43–77)
PLATELETS: 192 10*3/uL (ref 150–400)
RBC: 3.87 MIL/uL (ref 3.87–5.11)
RDW: 14.8 % (ref 11.5–15.5)
RUBELLA: 8.12 {index} — AB (ref <0.90)
Rh Type: POSITIVE
WBC: 8.8 10*3/uL (ref 4.0–10.5)

## 2014-01-10 LAB — GC/CHLAMYDIA PROBE AMP
CT PROBE, AMP APTIMA: NEGATIVE
GC PROBE AMP APTIMA: NEGATIVE

## 2014-01-10 LAB — GLUCOSE TOLERANCE, 1 HOUR (50G) W/O FASTING: Glucose, 1 Hour GTT: 110 mg/dL (ref 70–140)

## 2014-01-10 LAB — HIV ANTIBODY (ROUTINE TESTING W REFLEX): HIV: NONREACTIVE

## 2014-01-11 LAB — PRESCRIPTION MONITORING PROFILE (19 PANEL)
AMPHETAMINE/METH: NEGATIVE ng/mL
BARBITURATE SCREEN, URINE: NEGATIVE ng/mL
BUPRENORPHINE, URINE: NEGATIVE ng/mL
Benzodiazepine Screen, Urine: NEGATIVE ng/mL
CANNABINOID SCRN UR: NEGATIVE ng/mL
CARISOPRODOL, URINE: NEGATIVE ng/mL
Cocaine Metabolites: NEGATIVE ng/mL
Creatinine, Urine: 287.61 mg/dL (ref >20.0)
Fentanyl, Ur: NEGATIVE ng/mL
MDMA URINE: NEGATIVE ng/mL
METHADONE SCREEN, URINE: NEGATIVE ng/mL
METHAQUALONE SCREEN (URINE): NEGATIVE ng/mL
Meperidine, Ur: NEGATIVE ng/mL
NITRITES URINE, INITIAL: NEGATIVE ug/mL
OPIATE SCREEN, URINE: NEGATIVE ng/mL
OXYCODONE SCRN UR: NEGATIVE ng/mL
PROPOXYPHENE: NEGATIVE ng/mL
Phencyclidine, Ur: NEGATIVE ng/mL
TAPENTADOLUR: NEGATIVE ng/mL
Tramadol Scrn, Ur: NEGATIVE ng/mL
Zolpidem, Urine: NEGATIVE ng/mL
pH, Initial: 6.6 pH (ref 4.5–8.9)

## 2014-01-11 LAB — HEMOGLOBINOPATHY EVALUATION
HGB A2 QUANT: 1.6 % — AB (ref 2.2–3.2)
HGB A: 98.4 % — AB (ref 96.8–97.8)
HGB F QUANT: 0 % (ref 0.0–2.0)
HGB S QUANTITAION: 0 %
Hemoglobin Other: 0 %

## 2014-01-11 LAB — CULTURE, OB URINE
Colony Count: NO GROWTH
ORGANISM ID, BACTERIA: NO GROWTH

## 2014-01-25 ENCOUNTER — Ambulatory Visit (INDEPENDENT_AMBULATORY_CARE_PROVIDER_SITE_OTHER): Payer: Medicaid Other | Admitting: Advanced Practice Midwife

## 2014-01-25 ENCOUNTER — Ambulatory Visit (INDEPENDENT_AMBULATORY_CARE_PROVIDER_SITE_OTHER): Payer: Medicaid Other | Admitting: Family

## 2014-01-25 VITALS — BP 120/76 | HR 94 | Temp 97.1°F | Wt 394.8 lb

## 2014-01-25 DIAGNOSIS — Z23 Encounter for immunization: Secondary | ICD-10-CM

## 2014-01-25 DIAGNOSIS — O30002 Twin pregnancy, unspecified number of placenta and unspecified number of amniotic sacs, second trimester: Secondary | ICD-10-CM

## 2014-01-25 DIAGNOSIS — O30009 Twin pregnancy, unspecified number of placenta and unspecified number of amniotic sacs, unspecified trimester: Secondary | ICD-10-CM

## 2014-01-25 DIAGNOSIS — O0933 Supervision of pregnancy with insufficient antenatal care, third trimester: Secondary | ICD-10-CM

## 2014-01-25 DIAGNOSIS — O0930 Supervision of pregnancy with insufficient antenatal care, unspecified trimester: Secondary | ICD-10-CM

## 2014-01-25 LAB — POCT URINALYSIS DIP (DEVICE)
Glucose, UA: NEGATIVE mg/dL
Hgb urine dipstick: NEGATIVE
KETONES UR: 40 mg/dL — AB
NITRITE: NEGATIVE
PH: 6.5 (ref 5.0–8.0)
PROTEIN: 30 mg/dL — AB
Specific Gravity, Urine: >=1.03 (ref 1.005–1.030)
Urobilinogen, UA: 0.2 mg/dL (ref 0.0–1.0)

## 2014-01-25 MED ORDER — TETANUS-DIPHTH-ACELL PERTUSSIS 5-2.5-18.5 LF-MCG/0.5 IM SUSP
0.5000 mL | Freq: Once | INTRAMUSCULAR | Status: DC
Start: 2014-01-25 — End: 2014-03-04

## 2014-01-25 NOTE — Progress Notes (Signed)
No questions or concerns.  Next growth US on 6/29.

## 2014-01-27 MED ORDER — TETANUS-DIPHTH-ACELL PERTUSSIS 5-2.5-18.5 LF-MCG/0.5 IM SUSP: 0.5000 mL | Freq: Once | INTRAMUSCULAR | Status: DC

## 2014-01-27 NOTE — Progress Notes (Signed)
This pt was seen by Erskine EmeryWalidah Muhammed CNM

## 2014-01-30 ENCOUNTER — Ambulatory Visit (HOSPITAL_COMMUNITY)
Admission: RE | Admit: 2014-01-30 | Discharge: 2014-01-30 | Disposition: A | Discharge status: Home / Self Care | Payer: Medicaid Other | Source: Ambulatory Visit | Attending: Obstetrics and Gynecology | Admitting: Obstetrics and Gynecology

## 2014-01-30 ENCOUNTER — Other Ambulatory Visit: Payer: Self-pay | Admitting: Obstetrics and Gynecology

## 2014-01-30 DIAGNOSIS — Z3689 Encounter for other specified antenatal screening: Secondary | ICD-10-CM | POA: Insufficient documentation

## 2014-01-30 DIAGNOSIS — O0933 Supervision of pregnancy with insufficient antenatal care, third trimester: Secondary | ICD-10-CM

## 2014-01-30 DIAGNOSIS — O99210 Obesity complicating pregnancy, unspecified trimester: Principal | ICD-10-CM

## 2014-01-30 DIAGNOSIS — E669 Obesity, unspecified: Secondary | ICD-10-CM | POA: Insufficient documentation

## 2014-02-09 ENCOUNTER — Encounter: Payer: Self-pay | Admitting: Obstetrics & Gynecology

## 2014-02-14 ENCOUNTER — Ambulatory Visit (INDEPENDENT_AMBULATORY_CARE_PROVIDER_SITE_OTHER): Payer: Medicaid Other | Admitting: Advanced Practice Midwife

## 2014-02-14 VITALS — Temp 97.7°F | Wt 398.0 lb

## 2014-02-14 DIAGNOSIS — O30049 Twin pregnancy, dichorionic/diamniotic, unspecified trimester: Secondary | ICD-10-CM

## 2014-02-14 DIAGNOSIS — O30009 Twin pregnancy, unspecified number of placenta and unspecified number of amniotic sacs, unspecified trimester: Secondary | ICD-10-CM

## 2014-02-14 DIAGNOSIS — O30002 Twin pregnancy, unspecified number of placenta and unspecified number of amniotic sacs, second trimester: Secondary | ICD-10-CM

## 2014-02-14 LAB — POCT URINALYSIS DIP (DEVICE)
Bilirubin Urine: NEGATIVE
Glucose, UA: NEGATIVE mg/dL
Hgb urine dipstick: NEGATIVE
Ketones, ur: NEGATIVE mg/dL
Nitrite: NEGATIVE
PH: 6.5 (ref 5.0–8.0)
PROTEIN: 30 mg/dL — AB
SPECIFIC GRAVITY, URINE: 1.025 (ref 1.005–1.030)
Urobilinogen, UA: 0.2 mg/dL (ref 0.0–1.0)

## 2014-02-14 MED ORDER — PRENATAL PLUS 27-1 MG PO TABS: 1.0000 | ORAL_TABLET | Freq: Every day | ORAL | Status: DC

## 2014-02-14 NOTE — Progress Notes (Signed)
Doing well.  Good fetal movement, denies vaginal bleeding, LOF, regular contractions.  Reports shortness of breath with exertion, improves with rest.  Not sleeping well, discussed positions to sleep, pillows for comfort.

## 2014-02-14 NOTE — Progress Notes (Signed)
US scheduled for July 28 @ 1445

## 2014-02-14 NOTE — Progress Notes (Signed)
Reports feeling always out of breath.

## 2014-02-28 ENCOUNTER — Other Ambulatory Visit: Payer: Self-pay | Admitting: Advanced Practice Midwife

## 2014-02-28 ENCOUNTER — Ambulatory Visit (HOSPITAL_COMMUNITY)
Admission: RE | Admit: 2014-02-28 | Discharge: 2014-02-28 | Disposition: A | Discharge status: Home / Self Care | Payer: Medicaid Other | Source: Ambulatory Visit | Attending: Advanced Practice Midwife | Admitting: Advanced Practice Midwife

## 2014-02-28 DIAGNOSIS — O30009 Twin pregnancy, unspecified number of placenta and unspecified number of amniotic sacs, unspecified trimester: Secondary | ICD-10-CM

## 2014-02-28 DIAGNOSIS — Z3689 Encounter for other specified antenatal screening: Secondary | ICD-10-CM | POA: Insufficient documentation

## 2014-02-28 DIAGNOSIS — O30049 Twin pregnancy, dichorionic/diamniotic, unspecified trimester: Secondary | ICD-10-CM | POA: Insufficient documentation

## 2014-02-28 DIAGNOSIS — O30002 Twin pregnancy, unspecified number of placenta and unspecified number of amniotic sacs, second trimester: Secondary | ICD-10-CM

## 2014-03-01 ENCOUNTER — Encounter (HOSPITAL_COMMUNITY): Payer: Self-pay | Admitting: *Deleted

## 2014-03-01 ENCOUNTER — Telehealth: Payer: Self-pay | Admitting: *Deleted

## 2014-03-01 ENCOUNTER — Inpatient Hospital Stay (HOSPITAL_COMMUNITY)
Admission: AD | Admit: 2014-03-01 | Discharge: 2014-03-04 | DRG: 778 | Disposition: A | Discharge status: Home / Self Care | Payer: Medicaid Other | Source: Ambulatory Visit | Attending: Obstetrics and Gynecology | Admitting: Obstetrics and Gynecology

## 2014-03-01 DIAGNOSIS — E669 Obesity, unspecified: Secondary | ICD-10-CM | POA: Diagnosis present

## 2014-03-01 DIAGNOSIS — F1210 Cannabis abuse, uncomplicated: Secondary | ICD-10-CM

## 2014-03-01 DIAGNOSIS — O30002 Twin pregnancy, unspecified number of placenta and unspecified number of amniotic sacs, second trimester: Secondary | ICD-10-CM | POA: Diagnosis present

## 2014-03-01 DIAGNOSIS — Z6841 Body Mass Index (BMI) 40.0 and over, adult: Secondary | ICD-10-CM | POA: Diagnosis not present

## 2014-03-01 DIAGNOSIS — O30049 Twin pregnancy, dichorionic/diamniotic, unspecified trimester: Secondary | ICD-10-CM

## 2014-03-01 DIAGNOSIS — O99340 Other mental disorders complicating pregnancy, unspecified trimester: Secondary | ICD-10-CM | POA: Diagnosis present

## 2014-03-01 DIAGNOSIS — O30009 Twin pregnancy, unspecified number of placenta and unspecified number of amniotic sacs, unspecified trimester: Secondary | ICD-10-CM | POA: Diagnosis present

## 2014-03-01 DIAGNOSIS — O99210 Obesity complicating pregnancy, unspecified trimester: Secondary | ICD-10-CM

## 2014-03-01 DIAGNOSIS — Z8249 Family history of ischemic heart disease and other diseases of the circulatory system: Secondary | ICD-10-CM | POA: Diagnosis not present

## 2014-03-01 DIAGNOSIS — Z833 Family history of diabetes mellitus: Secondary | ICD-10-CM | POA: Diagnosis not present

## 2014-03-01 DIAGNOSIS — O4700 False labor before 37 completed weeks of gestation, unspecified trimester: Secondary | ICD-10-CM

## 2014-03-01 LAB — URINALYSIS, ROUTINE W REFLEX MICROSCOPIC
Bilirubin Urine: NEGATIVE
Glucose, UA: NEGATIVE mg/dL
Hgb urine dipstick: NEGATIVE
Ketones, ur: 15 mg/dL — AB
Leukocytes, UA: NEGATIVE
NITRITE: NEGATIVE
Protein, ur: NEGATIVE mg/dL
Specific Gravity, Urine: 1.025 (ref 1.005–1.030)
Urobilinogen, UA: 0.2 mg/dL (ref 0.0–1.0)
pH: 6 (ref 5.0–8.0)

## 2014-03-01 LAB — CBC
HCT: 33.1 % — ABNORMAL LOW (ref 36.0–46.0)
HEMOGLOBIN: 10.8 g/dL — AB (ref 12.0–15.0)
MCH: 26.6 pg (ref 26.0–34.0)
MCHC: 32.6 g/dL (ref 30.0–36.0)
MCV: 81.5 fL (ref 78.0–100.0)
PLATELETS: 167 10*3/uL (ref 150–400)
RBC: 4.06 MIL/uL (ref 3.87–5.11)
RDW: 15.6 % — ABNORMAL HIGH (ref 11.5–15.5)
WBC: 9.8 10*3/uL (ref 4.0–10.5)

## 2014-03-01 LAB — OB RESULTS CONSOLE GBS: STREP GROUP B AG: NEGATIVE

## 2014-03-01 LAB — FETAL FIBRONECTIN: Fetal Fibronectin: NEGATIVE

## 2014-03-01 MED ORDER — ACETAMINOPHEN 325 MG PO TABS: 650.0000 mg | ORAL_TABLET | ORAL | Status: DC | PRN

## 2014-03-01 MED ORDER — CALCIUM CARBONATE ANTACID 500 MG PO CHEW
2.0000 | CHEWABLE_TABLET | ORAL | Status: DC | PRN
  Administered 2014-03-02: 400 mg via ORAL
  Filled 2014-03-01: qty 1

## 2014-03-01 MED ORDER — DOCUSATE SODIUM 100 MG PO CAPS
100.0000 mg | ORAL_CAPSULE | Freq: Every day | ORAL | Status: DC
Start: 2014-03-02 — End: 2014-03-04
  Administered 2014-03-03: 100 mg via ORAL
  Filled 2014-03-01 (×2): qty 1

## 2014-03-01 MED ORDER — PRENATAL MULTIVITAMIN CH
1.0000 | ORAL_TABLET | Freq: Every day | ORAL | Status: DC
  Administered 2014-03-03: 1 via ORAL
  Filled 2014-03-01 (×2): qty 1

## 2014-03-01 MED ORDER — BETAMETHASONE SOD PHOS & ACET 6 (3-3) MG/ML IJ SUSP
12.0000 mg | INTRAMUSCULAR | Status: AC
  Administered 2014-03-02 (×2): 12 mg via INTRAMUSCULAR
  Filled 2014-03-01 (×2): qty 2

## 2014-03-01 MED ORDER — MAGNESIUM SULFATE 40 G IN LACTATED RINGERS - SIMPLE
2.0000 g/h | INTRAVENOUS | Status: DC
  Administered 2014-03-02 (×2): 2 g/h via INTRAVENOUS
  Filled 2014-03-01 (×2): qty 500

## 2014-03-01 MED ORDER — ZOLPIDEM TARTRATE 5 MG PO TABS: 5.0000 mg | ORAL_TABLET | Freq: Every evening | ORAL | Status: DC | PRN

## 2014-03-01 MED ORDER — LACTATED RINGERS IV SOLN
INTRAVENOUS | Status: DC
  Administered 2014-03-01: 50 mL via INTRAVENOUS
  Administered 2014-03-02: 16:00:00 via INTRAVENOUS

## 2014-03-01 MED ORDER — MAGNESIUM SULFATE BOLUS VIA INFUSION
6.0000 g | Freq: Once | INTRAVENOUS | Status: AC
  Administered 2014-03-02: 6 g via INTRAVENOUS
  Filled 2014-03-01: qty 500

## 2014-03-01 NOTE — [facility] Note (Signed)
Pt reports contractions , now having sharp pains in vaginal area.

## 2014-03-01 NOTE — Telephone Encounter (Signed)
Patient called clinic stating that she is having contractions every 5 min all morning long. They are very regular. Patient worried that she may be in labor. Advised patient that she can go to [facility]U for evaluation of labor. Patient agreeable.

## 2014-03-01 NOTE — H&P (Signed)
Beth Calderon is a 28 y.o. female G2P1001 presenting for contractions. Maternal Medical History:  Reason for admission: Nausea.     28 year old G2P1001 at [redacted]w[redacted]d gestation who presents with complaint of contractions. Patient has documented twin pregnancy. Ultrasound yesterday showed both twins with no abnormalities. Earlier today she noted contractions that were happening every 5-10 minutes mild to moderate in severity. She has had no vaginal bleeding/discharge/leakage of fluid. Positive fetal movement. She has contractions during our encounter today and she is able to carry on conversation without interruption and does not appear uncomfortable.  OB History   Grav Para Term Preterm Abortions TAB SAB Ect Mult Living   2 1 1       1      Past Medical History  Diagnosis Date  . Anemia   . H/O seasonal allergies    Past Surgical History  Procedure Laterality Date  . No past surgeries     Family History: family history includes Cancer in her maternal grandmother; Diabetes in her maternal uncle; Hypertension in her father, maternal grandfather, maternal grandmother, maternal uncle, mother, paternal grandfather, and paternal grandmother; Kidney disease in her maternal uncle. Social History:  reports that she has never smoked. She does not have any smokeless tobacco history on file. She reports that she drinks alcohol. She reports that she uses illicit drugs (Marijuana).   Prenatal Transfer Tool  Maternal Diabetes: No Genetic Screening: Normal Maternal Ultrasounds/Referrals: Normal Fetal Ultrasounds or other Referrals:  Other:  Twins, fetus A cephalic, fetus B transverse Maternal Substance Abuse:  No Significant Maternal Medications:  None Significant Maternal Lab Results:  None Other Comments:  None  Review of Systems  Constitutional: Negative for fever and chills.  HENT: Negative for hearing loss.   Eyes: Negative for blurred vision and double vision.  Respiratory: Negative for  cough and shortness of breath.   Cardiovascular: Negative for chest pain and palpitations.  Gastrointestinal: Negative for heartburn, nausea, vomiting, diarrhea and constipation.  Genitourinary: Negative for dysuria and urgency.  Musculoskeletal: Negative for myalgias.  Skin: Negative for itching and rash.  Neurological: Negative for dizziness, tingling and headaches.    Dilation: 4.5 Blood pressure 122/90, pulse 107, temperature 98.6 F (37 C), temperature source Oral, resp. rate 20, height 5\' 7"  (1.702 m), weight 181.892 kg (401 lb), last menstrual period 07/08/2013, SpO2 99.00%, currently breastfeeding. Exam Physical Exam  Constitutional: She is oriented to person, place, and time. She appears well-developed and well-nourished.  Obese female lying in bed in no acute distress  Cardiovascular: Normal rate and regular rhythm.   Respiratory: Effort normal and breath sounds normal.  GI: Soft. She exhibits no distension. There is no rebound.  Gravid, non-tender  Genitourinary: Vagina normal.  Cervical exam 4-5 cm, 50% effaced, -2 station  Musculoskeletal: She exhibits no edema and no tenderness.  Neurological: She is alert and oriented to person, place, and time.  Skin: Skin is warm and dry.    Prenatal labs: ABO, Rh: A/POS/-- (06/08 1549) Antibody: NEG (06/08 1549) Rubella: 8.12 (06/08 1549) RPR: NON REAC (06/08 1549)  HBsAg: NEGATIVE (06/08 1549)  HIV: NONREACTIVE (06/08 1549)  GBS:     Assessment/Plan: 28 year old female at [redacted]w[redacted]d gestation with signs of preterm labor. Fetal fibronectin pending. Given cervical dilation, will admit and start magnesium for tocolysis and give first dose of betamethasone. Both Fetal heart tracings are good. No obvious contraction pattern on the monitor. Not in much pain now. Will give fentanyl if pain progresses.  Patient seen with [name] who assisted with formation of treatment plan.  [name], [name] 03/01/2014, 10:33  PM     `````Attestation of Attending Supervision of Advanced Practitioner: Evaluation and management procedures were performed by the PA/NP/CNM/OB Fellow under my supervision/collaboration. Chart reviewed and agree with management and plan.  [name] 03/02/2014 6:01 AM

## 2014-03-02 LAB — TYPE AND SCREEN
ABO/RH(D): A POS
Antibody Screen: NEGATIVE

## 2014-03-02 NOTE — Progress Notes (Signed)
03/02/14 1500  Clinical Encounter Type  Visited With Patient not available  Referral From Nurse Johnnye Sima([facility] Tatro, [facility], [facility])  Spiritual Encounters  Spiritual Needs Emotional   Aware per RN referral that pt was overheard having loud argument on phone (possibly with FOB).  Attempted visit, but pt on phone with female friend.  [facility] is following from a distance and plans to f/u tomorrow, but please also page 24/7 as needs arise.  Thank you.  [address] Mountainview StreetChaplain Kwesi Sangha BrayLundeen, [place] DakotaMDiv [phone]

## 2014-03-02 NOTE — Progress Notes (Signed)
UR completed 

## 2014-03-03 DIAGNOSIS — O4700 False labor before 37 completed weeks of gestation, unspecified trimester: Principal | ICD-10-CM

## 2014-03-03 LAB — CULTURE, BETA STREP (GROUP B ONLY)

## 2014-03-03 MED ORDER — SODIUM CHLORIDE 0.9 % IJ SOLN: 3.0000 mL | Freq: Two times a day (BID) | INTRAMUSCULAR | Status: DC

## 2014-03-03 MED ORDER — SODIUM CHLORIDE 0.9 % IJ SOLN: 3.0000 mL | INTRAMUSCULAR | Status: DC | PRN

## 2014-03-03 NOTE — Progress Notes (Signed)
Patient ID: Beth Calderon, female   DOB: 04-03-1986, 28 y.o.   MRN: [id number] FACULTY PRACTICE ANTEPARTUM(COMPREHENSIVE) NOTE  Beth Calderon is a 28 y.o. G2P1001 at 7339w0d with Di/Di twins who is admitted for Preterm labor.    Fetal presentation is vertex/vertex. Length of Stay:  2  Days  Date of admission:03/01/2014  Subjective: Patient is doing well without complaints. Patient reports the fetal movement as active. Patient reports uterine contraction  activity as none. Patient reports  vaginal bleeding as none. Patient describes fluid per vagina as None.  Vitals:  Blood pressure 143/88, pulse 94, temperature 98 F (36.7 C), temperature source Oral, resp. rate 20, height 5\' 7"  (1.702 m), weight 401 lb (181.892 kg), last menstrual period 07/08/2013, SpO2 100.00%, currently breastfeeding. Filed Vitals:   03/03/14 0300 03/03/14 0352 03/03/14 0500 03/03/14 0600  BP:  143/88    Pulse:  94    Temp:  98 F (36.7 C)    TempSrc:  Oral    Resp: 20 24 20 20   Height:      Weight:      SpO2:       Physical Examination:  General appearance - alert, well appearing, and in no distress Fundal Height:  size greater than dates and consistent with twins Pelvic Exam:  examination not indicated Cervical Exam: Not evaluated.  Extremities: extremities normal, atraumatic, no cyanosis or edema and Homans sign is negative, no sign of DVT with DTRs 2+ bilaterally Membranes:intact  Fetal Monitoring:  Baseline: 135 and 145 bpm, Variability: Good {> 6 bpm), Accelerations: Reactive, Decelerations: Absent and Toco: no contractions   reactive  Labs:  No results found for this or any previous visit (from the past 24 hour(s)).  Imaging Studies:      Medications:  Scheduled . docusate sodium  100 mg Oral Daily  . prenatal multivitamin  1 tablet Oral Q1200   I have reviewed the patient's current medications.  ASSESSMENT: G2P1001 4739w0d Estimated Date of Delivery: 04/14/14  Patient Active Problem  List   Diagnosis Date Noted  . Preterm labor 03/01/2014  . Twin pregnancy in second trimester 01/09/2014  . Abnormal Pap smear 02/01/2013  . Insufficient prenatal care in third trimester 01/19/2013  . Chlamydia infection complicating pregnancy in third trimester 01/19/2013  . Morbid obesity 01/19/2013    PLAN: 28 yo G2P1 at 4234 weeks with Di/Di twins admitte with PTL - Patient completed second dose of BMZ this morning - Magnesium sulfate for tocolysis was discontinued this morning - Continue routine care - D/C planning in am if remain stable  Toron Bowring 03/03/2014,7:18 AM

## 2014-03-04 DIAGNOSIS — O4700 False labor before 37 completed weeks of gestation, unspecified trimester: Secondary | ICD-10-CM

## 2014-03-04 NOTE — Discharge Instructions (Signed)
Preterm Labor Information Preterm labor is when labor starts before you are [redacted] weeks pregnant. The normal length of pregnancy is 39 to 41 weeks.  CAUSES  The cause of preterm labor is not often known. The most common known cause is infection. RISK FACTORS  Having a history of preterm labor.  Having your water break before it should.  Having a placenta that covers the opening of the cervix.  Having a placenta that breaks away from the uterus.  Having a cervix that is too weak to hold the baby in the uterus.  Having too much fluid in the amniotic sac.  Taking drugs or smoking while pregnant.  Not gaining enough weight while pregnant.  Being younger than 2618 and older than 26 years old.  Having a low income.  Being African American. SYMPTOMS  Period-like cramps, belly (abdominal) pain, or back pain.  Contractions that are regular, as often as six in an hour. They may be mild or painful.  Contractions that start at the top of the belly. They then move to the lower belly and back.  Lower belly pressure that seems to get stronger.  Bleeding from the vagina.  Fluid leaking from the vagina. TREATMENT  Treatment depends on:  Your condition.  The condition of your baby.  How many weeks pregnant you are. Your doctor may have you:  Take medicine to stop contractions.  Stay in bed except to use the restroom (bed rest).  Stay in the hospital. WHAT SHOULD YOU DO IF YOU THINK YOU ARE IN PRETERM LABOR? Call your doctor right away. You need to go to the hospital right away.  HOW CAN YOU PREVENT PRETERM LABOR IN FUTURE PREGNANCIES?  Stop smoking, if you smoke.  Maintain healthy weight gain.  Do not take drugs or be around chemicals that are not needed.  Tell your doctor if you think you have an infection.  Tell your doctor if you had a preterm labor before. Document Released: 10/17/2008 Document Revised: 05/11/2013 Document Reviewed: 10/17/2008 [facility] Patient  Information 2015 [place], [place]. This information is not intended to replace advice given to you by your health care provider. Make sure you discuss any questions you have with your health care provider. Fetal Movement Counts Patient Name: __________________________________________________ Patient Due Date: ____________________ Performing a fetal movement count is highly recommended in high-risk pregnancies, but it is good for every pregnant woman to do. Your health care provider may ask you to start counting fetal movements at 28 weeks of the pregnancy. Fetal movements often increase:  After eating a full meal.  After physical activity.  After eating or drinking something sweet or cold.  At rest. Pay attention to when you feel the baby is most active. This will help you notice a pattern of your baby's sleep and wake cycles and what factors contribute to an increase in fetal movement. It is important to perform a fetal movement count at the same time each day when your baby is normally most active.  HOW TO COUNT FETAL MOVEMENTS 1. Find a quiet and comfortable area to sit or lie down on your left side. Lying on your left side provides the best blood and oxygen circulation to your baby. 2. Write down the day and time on a sheet of paper or in a journal. 3. Start counting kicks, flutters, swishes, rolls, or jabs in a 2-hour period. You should feel at least 10 movements within 2 hours. 4. If you do not feel 10 movements in 2  hours, wait 2-3 hours and count again. Look for a change in the pattern or not enough counts in 2 hours. SEEK MEDICAL CARE IF:  You feel less than 10 counts in 2 hours, tried twice.  There is no movement in over an hour.  The pattern is changing or taking longer each day to reach 10 counts in 2 hours.  You feel the baby is not moving as he or she usually does. Date: ____________ Movements: ____________ Start time: ____________ Doreatha MartinFinish time: ____________  Date: ____________  Movements: ____________ Start time: ____________ Doreatha MartinFinish time: ____________ Date: ____________ Movements: ____________ Start time: ____________ Doreatha MartinFinish time: ____________ Date: ____________ Movements: ____________ Start time: ____________ Doreatha MartinFinish time: ____________ Date: ____________ Movements: ____________ Start time: ____________ Doreatha MartinFinish time: ____________ Date: ____________ Movements: ____________ Start time: ____________ Doreatha MartinFinish time: ____________ Date: ____________ Movements: ____________ Start time: ____________ Doreatha MartinFinish time: ____________ Date: ____________ Movements: ____________ Start time: ____________ Doreatha MartinFinish time: ____________  Date: ____________ Movements: ____________ Start time: ____________ Doreatha MartinFinish time: ____________ Date: ____________ Movements: ____________ Start time: ____________ Doreatha MartinFinish time: ____________ Date: ____________ Movements: ____________ Start time: ____________ Doreatha MartinFinish time: ____________ Date: ____________ Movements: ____________ Start time: ____________ Doreatha MartinFinish time: ____________ Date: ____________ Movements: ____________ Start time: ____________ Doreatha MartinFinish time: ____________ Date: ____________ Movements: ____________ Start time: ____________ Doreatha MartinFinish time: ____________ Date: ____________ Movements: ____________ Start time: ____________ Doreatha MartinFinish time: ____________  Date: ____________ Movements: ____________ Start time: ____________ Doreatha MartinFinish time: ____________ Date: ____________ Movements: ____________ Start time: ____________ Doreatha MartinFinish time: ____________ Date: ____________ Movements: ____________ Start time: ____________ Doreatha MartinFinish time: ____________ Date: ____________ Movements: ____________ Start time: ____________ Doreatha MartinFinish time: ____________ Date: ____________ Movements: ____________ Start time: ____________ Doreatha MartinFinish time: ____________ Date: ____________ Movements: ____________ Start time: ____________ Doreatha MartinFinish time: ____________ Date: ____________ Movements: ____________ Start time:  ____________ Doreatha MartinFinish time: ____________  Date: ____________ Movements: ____________ Start time: ____________ Doreatha MartinFinish time: ____________ Date: ____________ Movements: ____________ Start time: ____________ Doreatha MartinFinish time: ____________ Date: ____________ Movements: ____________ Start time: ____________ Doreatha MartinFinish time: ____________ Date: ____________ Movements: ____________ Start time: ____________ Doreatha MartinFinish time: ____________ Date: ____________ Movements: ____________ Start time: ____________ Doreatha MartinFinish time: ____________ Date: ____________ Movements: ____________ Start time: ____________ Doreatha MartinFinish time: ____________ Date: ____________ Movements: ____________ Start time: ____________ Doreatha MartinFinish time: ____________  Date: ____________ Movements: ____________ Start time: ____________ Doreatha MartinFinish time: ____________ Date: ____________ Movements: ____________ Start time: ____________ Doreatha MartinFinish time: ____________ Date: ____________ Movements: ____________ Start time: ____________ Doreatha MartinFinish time: ____________ Date: ____________ Movements: ____________ Start time: ____________ Doreatha MartinFinish time: ____________ Date: ____________ Movements: ____________ Start time: ____________ Doreatha MartinFinish time: ____________ Date: ____________ Movements: ____________ Start time: ____________ Doreatha MartinFinish time: ____________ Date: ____________ Movements: ____________ Start time: ____________ Doreatha MartinFinish time: ____________  Date: ____________ Movements: ____________ Start time: ____________ Doreatha MartinFinish time: ____________ Date: ____________ Movements: ____________ Start time: ____________ Doreatha MartinFinish time: ____________ Date: ____________ Movements: ____________ Start time: ____________ Doreatha MartinFinish time: ____________ Date: ____________ Movements: ____________ Start time: ____________ Doreatha MartinFinish time: ____________ Date: ____________ Movements: ____________ Start time: ____________ Doreatha MartinFinish time: ____________ Date: ____________ Movements: ____________ Start time: ____________ Doreatha MartinFinish time: ____________ Date:  ____________ Movements: ____________ Start time: ____________ Doreatha MartinFinish time: ____________  Date: ____________ Movements: ____________ Start time: ____________ Doreatha MartinFinish time: ____________ Date: ____________ Movements: ____________ Start time: ____________ Doreatha MartinFinish time: ____________ Date: ____________ Movements: ____________ Start time: ____________ Doreatha MartinFinish time: ____________ Date: ____________ Movements: ____________ Start time: ____________ Doreatha MartinFinish time: ____________ Date: ____________ Movements: ____________ Start time: ____________ Doreatha MartinFinish time: ____________ Date: ____________ Movements: ____________ Start time: ____________ Doreatha MartinFinish time: ____________ Date: ____________ Movements: ____________ Start time: ____________ Doreatha MartinFinish time: ____________  Date: ____________ Movements: ____________ Start time: ____________ Doreatha MartinFinish time: ____________ Date: ____________  Movements: ____________ Start time: ____________ Doreatha Martin time: ____________ Date: ____________ Movements: ____________ Start time: ____________ Doreatha Martin time: ____________ Date: ____________ Movements: ____________ Start time: ____________ Doreatha Martin time: ____________ Date: ____________ Movements: ____________ Start time: ____________ Doreatha Martin time: ____________ Date: ____________ Movements: ____________ Start time: ____________ Doreatha Martin time: ____________ Document Released: 08/20/2006 Document Revised: 12/05/2013 Document Reviewed: 05/17/2012 ExitCare Patient Information 2015 [place], [place]. This information is not intended to replace advice given to you by your health care provider. Make sure you discuss any questions you have with your health care provider.

## 2014-03-04 NOTE — Discharge Summary (Signed)
Antenatal Physician Discharge Summary  Patient ID: Beth Calderon MRN: [id number] DOB/AGE: [birth year] 27 y.o.  Admit date: 03/01/2014 Discharge date: 03/04/2014  Admission Diagnoses:  Preterm labor Twin gestation Morbid obesity  Discharge Diagnoses: Same  Prenatal Procedures: NST  Consults: None  Significant Diagnostic Studies:  Results for orders placed during the hospital encounter of 03/01/14 (from the past 168 hour(s))  URINALYSIS, ROUTINE W REFLEX MICROSCOPIC   Collection Time    03/01/14  9:38 PM      Result Value Ref Range   Color, Urine YELLOW  YELLOW   APPearance CLEAR  CLEAR   Specific Gravity, Urine 1.025  1.005 - 1.030   pH 6.0  5.0 - 8.0   Glucose, UA NEGATIVE  NEGATIVE mg/dL   Hgb urine dipstick NEGATIVE  NEGATIVE   Bilirubin Urine NEGATIVE  NEGATIVE   Ketones, ur 15 (*) NEGATIVE mg/dL   Protein, ur NEGATIVE  NEGATIVE mg/dL   Urobilinogen, UA 0.2  0.0 - 1.0 mg/dL   Nitrite NEGATIVE  NEGATIVE   Leukocytes, UA NEGATIVE  NEGATIVE  FETAL FIBRONECTIN   Collection Time    03/01/14 10:30 PM      Result Value Ref Range   Fetal Fibronectin NEGATIVE  NEGATIVE  CBC   Collection Time    03/01/14 10:50 PM      Result Value Ref Range   WBC 9.8  4.0 - 10.5 K/uL   RBC 4.06  3.87 - 5.11 MIL/uL   Hemoglobin 10.8 (*) 12.0 - 15.0 g/dL   HCT 40.9 (*) 81.1 - 91.4 %   MCV 81.5  78.0 - 100.0 fL   MCH 26.6  26.0 - 34.0 pg   MCHC 32.6  30.0 - 36.0 g/dL   RDW 78.2 (*) 95.6 - 21.3 %   Platelets 167  150 - 400 K/uL  TYPE AND SCREEN   Collection Time    03/01/14 10:50 PM      Result Value Ref Range   ABO/RH(D) A POS     Antibody Screen NEG     Sample Expiration 03/04/2014    CULTURE, BETA STREP (GROUP B ONLY)   Collection Time    03/01/14 11:05 PM      Result Value Ref Range   Specimen Description VAGINAL/RECTAL     Special Requests NONE     Culture       Value: NO GROUP B STREP (S.AGALACTIAE) ISOLATED     Performed at [facility]   Report Status  03/03/2014 FINAL      Treatments: IV hydration and betamethasone  Hospital Course:  This is a 28 y.o. G2P1001 with IUP at [redacted]w[redacted]d admitted for preterm labor. She was admitted with contractions, noted to have a cervical exam of 4-5/50/-2.  No leaking of fluid and no bleeding.  She was initially started on magnesium sulfate for tocolysis and neuroprotection and also received betamethasone x 2 doses.  Her Magnesium was discontinued. She was observed, fetal heart rate monitoring remained reassuring, and she had no signs/symptoms of progressing preterm labor or other maternal-fetal concerns.  Her cervical exam was unchanged from admission.  She was deemed stable for discharge to home with outpatient follow up.  Discharge Exam: BP 123/64  Pulse 82  Temp(Src) 98.3 F (36.8 C) (Oral)  Resp 22  Ht 5\' 7"  (1.702 m)  Wt 401 lb (181.892 kg)  BMI 62.79 kg/m2  SpO2 100%  LMP 07/08/2013 General appearance: alert, cooperative and appears stated age Resp: normal effort Cardio: regular rate  and rhythm GI: gravid, NT VE: 4-5/50/-1  FHR: A 140's, average variability, no decels, + accels, B 145's, average variability, no decels, + accels. Toco: irritability only  Discharge Condition: Stable  Disposition: 01-Home or Self Care  Discharge Instructions   Discharge activity:  Bathroom / Shower only    Complete by:  As directed      Discharge activity:  Up to eat    Complete by:  As directed      Discharge activity: Bedrest    Complete by:  As directed      Discharge diet:  No restrictions    Complete by:  As directed      Fetal Kick Count:  Lie on our left side for one hour after a meal, and count the number of times your baby kicks.  If it is less than 5 times, get up, move around and drink some juice.  Repeat the test 30 minutes later.  If it is still less than 5 kicks in an hour, notify your doctor.    Complete by:  As directed      Notify physician for a general feeling that "something is not  right"    Complete by:  As directed      Notify physician for increase or change in vaginal discharge    Complete by:  As directed      Notify physician for intestinal cramps, with or without diarrhea, sometimes described as "gas pain"    Complete by:  As directed      Notify physician for leaking of fluid    Complete by:  As directed      Notify physician for low, dull backache, unrelieved by heat or Tylenol    Complete by:  As directed      Notify physician for menstrual like cramps    Complete by:  As directed      Notify physician for pelvic pressure    Complete by:  As directed      Notify physician for uterine contractions.  These may be painless and feel like the uterus is tightening or the baby is  "balling up"    Complete by:  As directed      Notify physician for vaginal bleeding    Complete by:  As directed      PRETERM LABOR:  Includes any of the follwing symptoms that occur between 20 - [redacted] weeks gestation.  If these symptoms are not stopped, preterm labor can result in preterm delivery, placing your baby at risk    Complete by:  As directed             Medication List         acetaminophen 325 MG tablet  Commonly known as:  TYLENOL  Take 650 mg by mouth every 6 (six) hours as needed for moderate pain (tooth ache).     loratadine 10 MG tablet  Commonly known as:  CLARITIN  Take 10 mg by mouth daily as needed for allergies (sinus headaches).     prenatal vitamin w/FE, FA 27-1 MG Tabs tablet  Take 1 tablet by mouth daily.           Follow-up Information   Follow up with [facility].   Specialty:  Obstetrics and Gynecology   Contact information:   [address] [place] [address] [phone]      Follow up In 4 days. (Keep next appointment)       Signed:  Reva BoresPRATT,Girlie Veltri S M.D. 03/04/2014, 7:28 AM

## 2014-03-08 ENCOUNTER — Ambulatory Visit (INDEPENDENT_AMBULATORY_CARE_PROVIDER_SITE_OTHER): Payer: Medicaid Other | Admitting: Family

## 2014-03-08 VITALS — BP 133/73 | HR 107 | Wt >= 6400 oz

## 2014-03-08 DIAGNOSIS — O0933 Supervision of pregnancy with insufficient antenatal care, third trimester: Secondary | ICD-10-CM

## 2014-03-08 DIAGNOSIS — O0930 Supervision of pregnancy with insufficient antenatal care, unspecified trimester: Secondary | ICD-10-CM

## 2014-03-08 DIAGNOSIS — O30009 Twin pregnancy, unspecified number of placenta and unspecified number of amniotic sacs, unspecified trimester: Secondary | ICD-10-CM

## 2014-03-08 DIAGNOSIS — O30002 Twin pregnancy, unspecified number of placenta and unspecified number of amniotic sacs, second trimester: Secondary | ICD-10-CM

## 2014-03-08 LAB — POCT URINALYSIS DIP (DEVICE)
BILIRUBIN URINE: NEGATIVE
GLUCOSE, UA: NEGATIVE mg/dL
Hgb urine dipstick: NEGATIVE
Ketones, ur: NEGATIVE mg/dL
Nitrite: NEGATIVE
Protein, ur: NEGATIVE mg/dL
Specific Gravity, Urine: 1.015 (ref 1.005–1.030)
Urobilinogen, UA: 0.2 mg/dL (ref 0.0–1.0)
pH: 7 (ref 5.0–8.0)

## 2014-03-08 LAB — US OB FOLLOW UP

## 2014-03-08 NOTE — Progress Notes (Signed)
Reviewed ultrasound results (02/28/14).  Twin A 71%ile, Twin B 76%ile.  Recently released from hospital at 4-5 cm.  Received BMZ.  Reports having 1-2 contractions an hour.  Denies vaginal bleeding or leaking of fluid.    Cervix unchanged.  Fetal heart tones via ultrasound.  Schedule follow-up appointment in one week due to multiple gestation and advanced cervical dilatation.

## 2014-03-08 NOTE — Progress Notes (Signed)
Bedside US for FHR = Twin A = 143 per PW doppler, vtx ; Twin B = 140 per PW doppler, vtx

## 2014-03-13 ENCOUNTER — Encounter: Payer: Medicaid Other | Admitting: Advanced Practice Midwife

## 2014-03-16 ENCOUNTER — Ambulatory Visit (INDEPENDENT_AMBULATORY_CARE_PROVIDER_SITE_OTHER): Payer: Medicaid Other | Admitting: Obstetrics and Gynecology

## 2014-03-16 ENCOUNTER — Encounter: Payer: Self-pay | Admitting: Obstetrics and Gynecology

## 2014-03-16 VITALS — BP 111/87 | HR 110 | Temp 98.2°F | Wt 393.9 lb

## 2014-03-16 DIAGNOSIS — O30002 Twin pregnancy, unspecified number of placenta and unspecified number of amniotic sacs, second trimester: Secondary | ICD-10-CM

## 2014-03-16 DIAGNOSIS — O30009 Twin pregnancy, unspecified number of placenta and unspecified number of amniotic sacs, unspecified trimester: Secondary | ICD-10-CM

## 2014-03-16 DIAGNOSIS — O0930 Supervision of pregnancy with insufficient antenatal care, unspecified trimester: Secondary | ICD-10-CM

## 2014-03-16 DIAGNOSIS — O0933 Supervision of pregnancy with insufficient antenatal care, third trimester: Secondary | ICD-10-CM

## 2014-03-16 LAB — POCT URINALYSIS DIP (DEVICE)
BILIRUBIN URINE: NEGATIVE
Glucose, UA: NEGATIVE mg/dL
Hgb urine dipstick: NEGATIVE
KETONES UR: NEGATIVE mg/dL
Nitrite: NEGATIVE
Protein, ur: NEGATIVE mg/dL
Specific Gravity, Urine: 1.02 (ref 1.005–1.030)
Urobilinogen, UA: 0.2 mg/dL (ref 0.0–1.0)
pH: 6.5 (ref 5.0–8.0)

## 2014-03-16 NOTE — Progress Notes (Signed)
Patient is doing well without complaints. FM/labor precautions reviewed 

## 2014-03-16 NOTE — Progress Notes (Signed)
Patient reports pelvic pain/pressure and vaginal pain

## 2014-03-24 ENCOUNTER — Encounter (HOSPITAL_COMMUNITY): Payer: Self-pay | Admitting: *Deleted

## 2014-03-24 ENCOUNTER — Encounter (HOSPITAL_COMMUNITY): Payer: Medicaid Other | Admitting: Anesthesiology

## 2014-03-24 ENCOUNTER — Inpatient Hospital Stay (HOSPITAL_COMMUNITY): Payer: Medicaid Other | Admitting: Anesthesiology

## 2014-03-24 ENCOUNTER — Encounter (HOSPITAL_COMMUNITY)
Admission: AD | Disposition: A | Discharge status: Home / Self Care | Payer: Self-pay | Source: Ambulatory Visit | Attending: Obstetrics & Gynecology

## 2014-03-24 ENCOUNTER — Inpatient Hospital Stay (HOSPITAL_COMMUNITY)
Admission: AD | Admit: 2014-03-24 | Discharge: 2014-03-26 | DRG: 775 | Disposition: A | Discharge status: Home / Self Care | Payer: Medicaid Other | Source: Ambulatory Visit | Attending: Obstetrics & Gynecology | Admitting: Obstetrics & Gynecology

## 2014-03-24 DIAGNOSIS — IMO0001 Reserved for inherently not codable concepts without codable children: Secondary | ICD-10-CM

## 2014-03-24 DIAGNOSIS — O30049 Twin pregnancy, dichorionic/diamniotic, unspecified trimester: Secondary | ICD-10-CM | POA: Diagnosis not present

## 2014-03-24 DIAGNOSIS — O99214 Obesity complicating childbirth: Secondary | ICD-10-CM

## 2014-03-24 DIAGNOSIS — D649 Anemia, unspecified: Secondary | ICD-10-CM | POA: Diagnosis present

## 2014-03-24 DIAGNOSIS — O9902 Anemia complicating childbirth: Secondary | ICD-10-CM | POA: Diagnosis present

## 2014-03-24 DIAGNOSIS — O479 False labor, unspecified: Secondary | ICD-10-CM | POA: Diagnosis present

## 2014-03-24 DIAGNOSIS — Z6841 Body Mass Index (BMI) 40.0 and over, adult: Secondary | ICD-10-CM

## 2014-03-24 DIAGNOSIS — Z833 Family history of diabetes mellitus: Secondary | ICD-10-CM | POA: Diagnosis not present

## 2014-03-24 DIAGNOSIS — E669 Obesity, unspecified: Secondary | ICD-10-CM | POA: Diagnosis present

## 2014-03-24 DIAGNOSIS — Z8249 Family history of ischemic heart disease and other diseases of the circulatory system: Secondary | ICD-10-CM | POA: Diagnosis not present

## 2014-03-24 DIAGNOSIS — O30009 Twin pregnancy, unspecified number of placenta and unspecified number of amniotic sacs, unspecified trimester: Secondary | ICD-10-CM | POA: Diagnosis present

## 2014-03-24 LAB — CBC
HCT: 34.4 % — ABNORMAL LOW (ref 36.0–46.0)
Hemoglobin: 11.4 g/dL — ABNORMAL LOW (ref 12.0–15.0)
MCH: 26.5 pg (ref 26.0–34.0)
MCHC: 33.1 g/dL (ref 30.0–36.0)
MCV: 80 fL (ref 78.0–100.0)
Platelets: 163 10*3/uL (ref 150–400)
RBC: 4.3 MIL/uL (ref 3.87–5.11)
RDW: 16 % — AB (ref 11.5–15.5)
WBC: 8.6 10*3/uL (ref 4.0–10.5)

## 2014-03-24 LAB — SYPHILIS: RPR W/REFLEX TO RPR TITER AND TREPONEMAL ANTIBODIES, TRADITIONAL SCREENING AND DIAGNOSIS ALGORITHM

## 2014-03-24 LAB — TYPE AND SCREEN
ABO/RH(D): A POS
Antibody Screen: NEGATIVE

## 2014-03-24 SURGERY — Surgical Case
Anesthesia: Epidural | Site: Vagina

## 2014-03-24 MED ORDER — PHENYLEPHRINE 40 MCG/ML (10ML) SYRINGE FOR IV PUSH (FOR BLOOD PRESSURE SUPPORT): 80.0000 ug | PREFILLED_SYRINGE | INTRAVENOUS | Status: DC | PRN

## 2014-03-24 MED ORDER — IBUPROFEN 600 MG PO TABS: 600.0000 mg | ORAL_TABLET | Freq: Four times a day (QID) | ORAL | Status: DC | PRN

## 2014-03-24 MED ORDER — WITCH HAZEL-GLYCERIN EX PADS: 1.0000 "application " | MEDICATED_PAD | CUTANEOUS | Status: DC | PRN

## 2014-03-24 MED ORDER — SODIUM BICARBONATE 8.4 % IV SOLN
INTRAVENOUS | Status: AC
  Filled 2014-03-24: qty 50

## 2014-03-24 MED ORDER — PHENYLEPHRINE 40 MCG/ML (10ML) SYRINGE FOR IV PUSH (FOR BLOOD PRESSURE SUPPORT)
PREFILLED_SYRINGE | INTRAVENOUS | Status: AC
  Filled 2014-03-24: qty 10

## 2014-03-24 MED ORDER — DIPHENHYDRAMINE HCL 50 MG/ML IJ SOLN: 12.5000 mg | INTRAMUSCULAR | Status: DC | PRN

## 2014-03-24 MED ORDER — OXYTOCIN BOLUS FROM INFUSION
500.0000 mL | INTRAVENOUS | Status: DC
  Administered 2014-03-24: 500 mL via INTRAVENOUS

## 2014-03-24 MED ORDER — EPHEDRINE 5 MG/ML INJ: 10.0000 mg | INTRAVENOUS | Status: DC | PRN

## 2014-03-24 MED ORDER — SENNOSIDES-DOCUSATE SODIUM 8.6-50 MG PO TABS
2.0000 | ORAL_TABLET | ORAL | Status: DC
  Administered 2014-03-25 (×2): 2 via ORAL
  Filled 2014-03-24 (×2): qty 2

## 2014-03-24 MED ORDER — LANOLIN HYDROUS EX OINT: TOPICAL_OINTMENT | CUTANEOUS | Status: DC | PRN

## 2014-03-24 MED ORDER — LACTATED RINGERS IV SOLN
INTRAVENOUS | Status: DC
  Administered 2014-03-24 (×2): via INTRAVENOUS

## 2014-03-24 MED ORDER — LIDOCAINE-EPINEPHRINE (PF) 2 %-1:200000 IJ SOLN
INTRAMUSCULAR | Status: AC
  Filled 2014-03-24: qty 20

## 2014-03-24 MED ORDER — LACTATED RINGERS IV SOLN: 500.0000 mL | INTRAVENOUS | Status: DC | PRN

## 2014-03-24 MED ORDER — FENTANYL 2.5 MCG/ML BUPIVACAINE 1/10 % EPIDURAL INFUSION (WH - ANES)
14.0000 mL/h | INTRAMUSCULAR | Status: DC | PRN
  Administered 2014-03-24: 14 mL/h via EPIDURAL

## 2014-03-24 MED ORDER — LACTATED RINGERS IV SOLN
500.0000 mL | Freq: Once | INTRAVENOUS | Status: AC
  Administered 2014-03-24: 500 mL via INTRAVENOUS

## 2014-03-24 MED ORDER — OXYTOCIN 10 UNIT/ML IJ SOLN
40.0000 [IU] | INTRAVENOUS | Status: DC | PRN
  Administered 2014-03-24: 40 [IU] via INTRAVENOUS

## 2014-03-24 MED ORDER — FLEET ENEMA 7-19 GM/118ML RE ENEM: 1.0000 | ENEMA | RECTAL | Status: DC | PRN

## 2014-03-24 MED ORDER — OXYTOCIN 40 UNITS IN LACTATED RINGERS INFUSION - SIMPLE MED
62.5000 mL/h | INTRAVENOUS | Status: DC
  Administered 2014-03-24: 62.5 mL/h via INTRAVENOUS
  Filled 2014-03-24: qty 1000

## 2014-03-24 MED ORDER — TETANUS-DIPHTH-ACELL PERTUSSIS 5-2.5-18.5 LF-MCG/0.5 IM SUSP
0.5000 mL | Freq: Once | INTRAMUSCULAR | Status: DC
  Filled 2014-03-24: qty 0.5

## 2014-03-24 MED ORDER — LIDOCAINE HCL (PF) 1 % IJ SOLN: 30.0000 mL | INTRAMUSCULAR | Status: DC | PRN

## 2014-03-24 MED ORDER — IBUPROFEN 600 MG PO TABS
600.0000 mg | ORAL_TABLET | Freq: Four times a day (QID) | ORAL | Status: DC
  Administered 2014-03-24 – 2014-03-26 (×7): 600 mg via ORAL
  Filled 2014-03-24 (×7): qty 1

## 2014-03-24 MED ORDER — ONDANSETRON HCL 4 MG/2ML IJ SOLN: 4.0000 mg | INTRAMUSCULAR | Status: DC | PRN

## 2014-03-24 MED ORDER — ONDANSETRON HCL 4 MG/2ML IJ SOLN: 4.0000 mg | Freq: Four times a day (QID) | INTRAMUSCULAR | Status: DC | PRN

## 2014-03-24 MED ORDER — LIDOCAINE HCL (PF) 1 % IJ SOLN
INTRAMUSCULAR | Status: DC | PRN
  Administered 2014-03-24 (×2): 4 mL

## 2014-03-24 MED ORDER — FENTANYL 2.5 MCG/ML BUPIVACAINE 1/10 % EPIDURAL INFUSION (WH - ANES)
INTRAMUSCULAR | Status: DC | PRN
  Administered 2014-03-24: 14 mL/h via EPIDURAL

## 2014-03-24 MED ORDER — OXYCODONE-ACETAMINOPHEN 5-325 MG PO TABS: 1.0000 | ORAL_TABLET | ORAL | Status: DC | PRN

## 2014-03-24 MED ORDER — ACETAMINOPHEN 325 MG PO TABS
650.0000 mg | ORAL_TABLET | ORAL | Status: DC | PRN
Start: 2014-03-24 — End: 2014-03-24

## 2014-03-24 MED ORDER — ONDANSETRON HCL 4 MG PO TABS: 4.0000 mg | ORAL_TABLET | ORAL | Status: DC | PRN

## 2014-03-24 MED ORDER — FENTANYL CITRATE 0.05 MG/ML IJ SOLN: 100.0000 ug | INTRAMUSCULAR | Status: DC | PRN

## 2014-03-24 MED ORDER — DIPHENHYDRAMINE HCL 25 MG PO CAPS: 25.0000 mg | ORAL_CAPSULE | Freq: Four times a day (QID) | ORAL | Status: DC | PRN

## 2014-03-24 MED ORDER — BENZOCAINE-MENTHOL 20-0.5 % EX AERO
1.0000 "application " | INHALATION_SPRAY | CUTANEOUS | Status: DC | PRN
  Administered 2014-03-24: 1 via TOPICAL
  Filled 2014-03-24 (×2): qty 56

## 2014-03-24 MED ORDER — SIMETHICONE 80 MG PO CHEW: 80.0000 mg | CHEWABLE_TABLET | ORAL | Status: DC | PRN

## 2014-03-24 MED ORDER — FENTANYL 2.5 MCG/ML BUPIVACAINE 1/10 % EPIDURAL INFUSION (WH - ANES)
INTRAMUSCULAR | Status: AC
  Filled 2014-03-24: qty 125

## 2014-03-24 MED ORDER — DIBUCAINE 1 % RE OINT
1.0000 "application " | TOPICAL_OINTMENT | RECTAL | Status: DC | PRN
  Filled 2014-03-24: qty 28

## 2014-03-24 MED ORDER — ZOLPIDEM TARTRATE 5 MG PO TABS: 5.0000 mg | ORAL_TABLET | Freq: Every evening | ORAL | Status: DC | PRN

## 2014-03-24 MED ORDER — SODIUM BICARBONATE 8.4 % IV SOLN
INTRAVENOUS | Status: DC | PRN
  Administered 2014-03-24: 5 mL via EPIDURAL

## 2014-03-24 MED ORDER — PRENATAL MULTIVITAMIN CH
1.0000 | ORAL_TABLET | Freq: Every day | ORAL | Status: DC
  Administered 2014-03-25 – 2014-03-26 (×2): 1 via ORAL
  Filled 2014-03-24 (×2): qty 1

## 2014-03-24 MED ORDER — CITRIC ACID-SODIUM CITRATE 334-500 MG/5ML PO SOLN: 30.0000 mL | ORAL | Status: DC | PRN

## 2014-03-24 MED ORDER — OXYCODONE-ACETAMINOPHEN 5-325 MG PO TABS
1.0000 | ORAL_TABLET | ORAL | Status: DC | PRN
  Administered 2014-03-24: 1 via ORAL
  Administered 2014-03-25 (×2): 2 via ORAL
  Administered 2014-03-26: 1 via ORAL
  Filled 2014-03-24 (×2): qty 2
  Filled 2014-03-24 (×2): qty 1

## 2014-03-24 SURGICAL SUPPLY — 34 items
BLADE SURG 10 STRL SS (BLADE) ×8 IMPLANT
CLAMP CORD UMBIL (MISCELLANEOUS) IMPLANT
CLOTH BEACON ORANGE TIMEOUT ST (SAFETY) ×4 IMPLANT
DRAPE LG THREE QUARTER DISP (DRAPES) IMPLANT
DRSG OPSITE POSTOP 4X10 (GAUZE/BANDAGES/DRESSINGS) ×4 IMPLANT
DURAPREP 26ML APPLICATOR (WOUND CARE) ×4 IMPLANT
ELECT REM PT RETURN 9FT ADLT (ELECTROSURGICAL) ×4
ELECTRODE REM PT RTRN 9FT ADLT (ELECTROSURGICAL) ×2 IMPLANT
EXTRACTOR VACUUM M CUP 4 TUBE (SUCTIONS) IMPLANT
EXTRACTOR VACUUM M CUP 4' TUBE (SUCTIONS)
GLOVE BIOGEL PI IND STRL 7.0 (GLOVE) ×2 IMPLANT
GLOVE BIOGEL PI INDICATOR 7.0 (GLOVE) ×2
GLOVE ECLIPSE 7.0 STRL STRAW (GLOVE) ×4 IMPLANT
GOWN STRL REUS W/TWL LRG LVL3 (GOWN DISPOSABLE) ×8 IMPLANT
KIT ABG SYR 3ML LUER SLIP (SYRINGE) IMPLANT
NEEDLE HYPO 22GX1.5 SAFETY (NEEDLE) ×4 IMPLANT
NEEDLE HYPO 25X5/8 SAFETYGLIDE (NEEDLE) ×4 IMPLANT
NS IRRIG 1000ML POUR BTL (IV SOLUTION) ×4 IMPLANT
PACK C SECTION WH (CUSTOM PROCEDURE TRAY) ×4 IMPLANT
PAD ABD 7.5X8 STRL (GAUZE/BANDAGES/DRESSINGS) ×4 IMPLANT
PAD OB MATERNITY 4.3X12.25 (PERSONAL CARE ITEMS) ×4 IMPLANT
RTRCTR C-SECT PINK 25CM LRG (MISCELLANEOUS) IMPLANT
STAPLER VISISTAT 35W (STAPLE) IMPLANT
SUT PDS AB 0 CT1 27 (SUTURE) IMPLANT
SUT PDS AB 0 CTX 36 PDP370T (SUTURE) IMPLANT
SUT PLAIN 2 0 XLH (SUTURE) IMPLANT
SUT VIC AB 0 CT1 36 (SUTURE) ×8 IMPLANT
SUT VIC AB 0 CTX 36 (SUTURE) ×4
SUT VIC AB 0 CTX36XBRD ANBCTRL (SUTURE) ×4 IMPLANT
SUT VIC AB 4-0 KS 27 (SUTURE) ×4 IMPLANT
SYRINGE CONTROL L 12CC (SYRINGE) ×4 IMPLANT
TOWEL OR 17X24 6PK STRL BLUE (TOWEL DISPOSABLE) ×4 IMPLANT
TRAY FOLEY CATH 14FR (SET/KITS/TRAYS/PACK) ×4 IMPLANT
WATER STERILE IRR 1000ML POUR (IV SOLUTION) ×4 IMPLANT

## 2014-03-24 NOTE — Progress Notes (Signed)
Pt in OR .

## 2014-03-24 NOTE — H&P (Signed)
Beth SmallingMonique M Calderon is a 28 y.o. female G2P1001 with IUP at 8066w0d presenting for contractions/R/O ROM. Pt states she has been having regular, every 5-6 minutes contractions, associated with none vaginal bleeding.  Membranes are intact, with active fetal movement.   [facility]  Prenatal History/Complications:  Short interval bt pregnancies  PTL with admission:  [id number]  Past Medical History: Past Medical History  Diagnosis Date  . Anemia   . H/O seasonal allergies     Past Surgical History: Past Surgical History  Procedure Laterality Date  . No past surgeries      Obstetrical History: OB History   Grav Para Term Preterm Abortions TAB SAB Ect Mult Living   2 1 1       1         Social History: History   Social History  . Marital Status: Single    Spouse Name: N/A    Number of Children: N/A  . Years of Education: N/A   Social History Main Topics  . Smoking status: Never Smoker   . Smokeless tobacco: None  . Alcohol Use: Yes  . Drug Use: Yes    Special: Marijuana  . Sexual Activity: None     Comment: stopped ETOH and marijuana with positive preg test   Other Topics Concern  . None   Social History Narrative  . None    Family History: Family History  Problem Relation Age of Onset  . Hypertension Mother   . Hypertension Father   . Hypertension Maternal Uncle   . Kidney disease Maternal Uncle   . Diabetes Maternal Uncle   . Hypertension Maternal Grandmother   . Cancer Maternal Grandmother   . Hypertension Maternal Grandfather   . Hypertension Paternal Grandmother   . Hypertension Paternal Grandfather     Allergies: No Known Allergies  Prescriptions prior to admission  Medication Sig Dispense Refill  . acetaminophen (TYLENOL) 325 MG tablet Take 650 mg by mouth every 6 (six) hours as needed for moderate pain (tooth ache).       . loratadine (CLARITIN) 10 MG tablet Take 10 mg by mouth daily as needed for allergies (sinus headaches).       . prenatal vitamin w/FE, FA (PRENATAL 1 + 1) 27-1 MG TABS tablet Take 1 tablet by mouth daily.  30 each  3     Prenatal Transfer Tool  Maternal Diabetes: No Genetic Screening: Declined Maternal Ultrasounds/Referrals: Normal Fetal Ultrasounds or other Referrals:  None Maternal Substance Abuse:  No Significant Maternal Medications:  None Significant Maternal Lab Results: Lab values include: Group B Strep negative     Review of Systems   Constitutional: Negative for fever, chills, weight loss, malaise/fatigue and diaphoresis.  HENT: Negative for hearing loss, ear pain, nosebleeds, congestion, sore throat, neck pain, tinnitus and ear discharge.   Eyes: Negative for blurred vision, double vision, photophobia, pain, discharge and redness.  Respiratory: Negative for cough, hemoptysis, sputum production, shortness of breath, wheezing and stridor.   Cardiovascular: Negative for chest pain, palpitations, orthopnea,  leg swelling  Gastrointestinal: Positive for abdominal pain. Negative for heartburn, nausea, vomiting, diarrhea, constipation, blood in stool Genitourinary: Negative for dysuria, urgency, frequency, hematuria and flank pain.  Musculoskeletal: Negative for myalgias, back pain, joint pain and falls.  Skin: Negative for itching and rash.  Neurological: Negative for dizziness, tingling, tremors, sensory change, speech change, focal weakness, seizures, loss of consciousness, weakness and headaches.  Endo/Heme/Allergies: Negative for environmental allergies and polydipsia. Does  not bruise/bleed easily.  Psychiatric/Behavioral: Negative for depression, suicidal ideas, hallucinations, memory loss and substance abuse. The patient is not nervous/anxious and does not have insomnia.       Blood pressure 103/46, pulse 109, temperature 98.4 F (36.9 C), temperature source Oral, resp. rate 16, height 5\' 8"  (1.727 m), weight 181.711 kg (400 lb 9.6 oz), last menstrual period 07/08/2013,  currently breastfeeding. General appearance: alert, cooperative and no distress Lungs: clear to auscultation bilaterally Heart: regular rate and rhythm Abdomen: soft, non-tender; bowel sounds normal Pelvic: SSE; normal appearing d/c; fern and valsalva negative Extremities: Homans sign is negative, no sign of DVT DTR's 2+ Presentation: cephalic and tranxverse, head to maternal right on last u/s 2 weeks ago Fetal monitoringBaseline: 140 bpm, Variability: Good {> 6 bpm), Accelerations: Reactive and Decelerations: Absent X2 Uterine activity  Mild ctx q 5 minutes  Dilation: 6 Effacement (%): 80 Station: -2 Exam by:: Creszenzo-dishmon   Prenatal labs: ABO, Rh: --/--/A POS (07/29 2250) Antibody: NEG (07/29 2250) Rubella:  immune RPR: NON REAC (06/08 1549)  HBsAg: NEGATIVE (06/08 1549)  HIV: NONREACTIVE (06/08 1549)  GBS: Negative (07/29 0000)  1 hr Glucola 110 Genetic screening  Too late Anatomy US normal; EFW A=71%/ B=76%   No results found for this or any previous visit (from the past 24 hour(s)).  Assessment: Beth Calderon is a 28 y.o. G2P1001 with an IUP at [redacted]w[redacted]d presenting for active labor  Plan: #Labor: augment prn #Pain:  IV #FWBCat 1 X2 #ID: GBS: neg  #MOF:  breast   CRESENZO-DISHMAN,Salisha Bardsley 03/24/2014, 7:29 AM

## 2014-03-24 NOTE — Anesthesia Postprocedure Evaluation (Signed)
  Anesthesia Post-op Note  Patient: Beth Calderon  Procedure(s) Performed: Procedure(s) with comments: VAGINAL DELIVERY - vaginal delivery of twins  Patient Location: Rm. 172  Anesthesia Type:Epidural  Level of Consciousness: awake, alert  and oriented  Airway and Oxygen Therapy: Patient Spontanous Breathing  Post-op Pain: none  Post-op Assessment: Post-op Vital signs reviewed, Patient's Cardiovascular Status Stable, Respiratory Function Stable, Patent Airway, No signs of Nausea or vomiting, Pain level controlled, No headache and No backache  Post-op Vital Signs: Reviewed and stable  Last Vitals:  Filed Vitals:   03/24/14 1355  BP: 160/77  Pulse: 71  Temp:   Resp:     Complications: No apparent anesthesia complications

## 2014-03-24 NOTE — Progress Notes (Signed)
   Darcel SmallingMonique M Haji is a 28 y.o. G2P1001 at 2336w0d  admitted for active labor with di/di twin  Subjective: Unable to get a good read on FHTs. Patient in no pain currently.  Objective: Filed Vitals:   03/24/14 0721 03/24/14 0723 03/24/14 0807 03/24/14 0856  BP:  125/77 133/67 123/78  Pulse:  99 92 96  Temp: 98.4 F (36.9 C)  98.4 F (36.9 C)   TempSrc: Oral  Oral   Resp: 16  20 16   Height: 5\' 8"  (1.727 m)     Weight: 181.711 kg (400 lb 9.6 oz)         FHT: Unable to evaulate UC:  Unable to evaluate  SVE:   Dilation: 6 Effacement (%): 80 Station: -2 Exam by:: Creszenzo-dishmon   Labs: Lab Results  Component Value Date   WBC 8.6 03/24/2014   HGB 11.4* 03/24/2014   HCT 34.4* 03/24/2014   MCV 80.0 03/24/2014   PLT 163 03/24/2014    Assessment / Plan: Spontaneous labor, progressing normally  Labor: Progressing normally, given concern for poor readings, AROM- clear fluid. IUPC placement and scalp electrode placed Fetal Wellbeing:  Baby A wtih baseline 130s, mod varibility, and reactive acels. Baby B with baseline 140s, mod varibility, 10x10 acel, no decels Pain Control:  Fentanyl Anticipated MOD:  NSVD of twins. Given transverse position of baby B, will have double set up in OR.  Joanna PuffDorsey, Chrisanna Mishra S 03/24/2014, 9:42 AM

## 2014-03-24 NOTE — Anesthesia Postprocedure Evaluation (Signed)
  Anesthesia Post-op Note  Patient: Beth Calderon  Procedure(s) Performed: Procedure(s) with comments: VAGINAL DELIVERY - vaginal delivery of twins  Patient Location: Mother/Baby  Anesthesia Type:Epidural  Level of Consciousness: awake, alert  and oriented  Airway and Oxygen Therapy: Patient Spontanous Breathing  Post-op Pain: none  Post-op Assessment: Post-op Vital signs reviewed, Patient's Cardiovascular Status Stable, Respiratory Function Stable, No headache, No backache, No residual numbness and No residual motor weakness  Post-op Vital Signs: Reviewed and stable  Last Vitals:  Filed Vitals:   03/24/14 1552  BP: 138/82  Pulse:   Temp: 36.8 C  Resp: 20    Complications: No apparent anesthesia complications

## 2014-03-24 NOTE — Lactation Note (Signed)
This note was copied from the chart of Beth Darenda Lumadue. Lactation Consultation Note  Patient Name: Beth Calderon Today's Date: 03/24/2014 Reason for consult: Other (Comment) (charting for exclusion)   Maternal Data Formula Feeding for Exclusion: Yes Reason for exclusion: Mother's choice to formula feed on admision  Feeding Feeding Type: Bottle Fed - Formula  LATCH Score/Interventions                      Lactation Tools Discussed/Used     Consult Status Consult Status: Complete    Krysten Veronica Parmly 03/24/2014, 4:47 PM    

## 2014-03-24 NOTE — Progress Notes (Signed)
[facility]

## 2014-03-24 NOTE — Anesthesia Preprocedure Evaluation (Signed)
Anesthesia Evaluation  Patient identified by MRN, date of birth, ID band Patient awake    Reviewed: Allergy & Precautions, H&P , NPO status , Patient's Chart, lab work & pertinent test results  Airway Mallampati: III TM Distance: >3 FB Neck ROM: Full    Dental no notable dental hx. (+) Teeth Intact   Pulmonary neg pulmonary ROS,  breath sounds clear to auscultation  Pulmonary exam normal       Cardiovascular negative cardio ROS  Rhythm:Regular Rate:Normal     Neuro/Psych negative neurological ROS  negative psych ROS   GI/Hepatic Neg liver ROS, GERD-  Medicated and Controlled,  Endo/Other  Morbid obesitySuper MO  Renal/GU negative Renal ROS  negative genitourinary   Musculoskeletal   Abdominal (+) + obese,   Peds  Hematology  (+) anemia ,   Anesthesia Other Findings   Reproductive/Obstetrics (+) Pregnancy Twin Gestation                           Anesthesia Physical Anesthesia Plan  ASA: III  Anesthesia Plan: Epidural   Post-op Pain Management:    Induction:   Airway Management Planned: Natural Airway  Additional Equipment:   Intra-op Plan:   Post-operative Plan:   Informed Consent: I have reviewed the patients History and Physical, chart, labs and discussed the procedure including the risks, benefits and alternatives for the proposed anesthesia with the patient or authorized representative who has indicated his/her understanding and acceptance.     Plan Discussed with: Anesthesiologist  Anesthesia Plan Comments:         Anesthesia Quick Evaluation  

## 2014-03-24 NOTE — Anesthesia Procedure Notes (Signed)
Epidural Patient location during procedure: OB Start time: 03/24/2014 10:50 AM  Staffing Anesthesiologist: Ashyla Luth A. Performed by: anesthesiologist   Preanesthetic Checklist Completed: patient identified, site marked, surgical consent, pre-op evaluation, timeout performed, IV checked, risks and benefits discussed and monitors and equipment checked  Epidural Patient position: sitting Prep: site prepped and draped and DuraPrep Patient monitoring: continuous pulse ox and blood pressure Approach: midline Location: L3-L4 Injection technique: LOR air  Needle:  Needle type: Tuohy  Needle gauge: 17 G Needle length: 9 cm and 9 Needle insertion depth: 9 cm Catheter type: closed end flexible Catheter size: 19 Gauge Catheter at skin depth: 14 cm Test dose: negative and Other  Assessment Events: blood not aspirated, injection not painful, no injection resistance, negative IV test and no paresthesia  Additional Notes Patient identified. Risks and benefits discussed including failed block, incomplete  Pain control, post dural puncture headache, nerve damage, paralysis, blood pressure Changes, nausea, vomiting, reactions to medications-both toxic and allergic and post Partum back pain. All questions were answered. Patient expressed understanding and wished to proceed. Sterile technique was used throughout procedure. Epidural site was Dressed with sterile barrier dressing. No paresthesias, signs of intravascular injection Or signs of intrathecal spread were encountered.  Patient was more comfortable after the epidural was dosed. Please see RN's note for documentation of vital signs and FHR which are stable.

## 2014-03-24 NOTE — Progress Notes (Signed)
Provider notified of difficulty tracing twins

## 2014-03-24 NOTE — Addendum Note (Signed)
Addendum created 03/24/14 1801 by Shanon PayorSuzanne M Donyae Kohn, CRNA   Modules edited: Notes Section   Notes Section:  File: [id number]

## 2014-03-24 NOTE — Anesthesia Preprocedure Evaluation (Signed)
Anesthesia Evaluation  Patient identified by MRN, date of birth, ID band Patient awake    Reviewed: Allergy & Precautions, H&P , NPO status , Patient's Chart, lab work & pertinent test results  Airway Mallampati: III TM Distance: >3 FB Neck ROM: Full    Dental no notable dental hx. (+) Teeth Intact   Pulmonary neg pulmonary ROS,  breath sounds clear to auscultation  Pulmonary exam normal       Cardiovascular negative cardio ROS  Rhythm:Regular Rate:Normal     Neuro/Psych negative neurological ROS  negative psych ROS   GI/Hepatic Neg liver ROS, GERD-  Medicated and Controlled,  Endo/Other  Morbid obesitySuper MO  Renal/GU negative Renal ROS  negative genitourinary   Musculoskeletal   Abdominal (+) + obese,   Peds  Hematology  (+) anemia ,   Anesthesia Other Findings   Reproductive/Obstetrics (+) Pregnancy Twin Gestation                           Anesthesia Physical Anesthesia Plan  ASA: III  Anesthesia Plan: Epidural   Post-op Pain Management:    Induction:   Airway Management Planned: Natural Airway  Additional Equipment:   Intra-op Plan:   Post-operative Plan:   Informed Consent: I have reviewed the patients History and Physical, chart, labs and discussed the procedure including the risks, benefits and alternatives for the proposed anesthesia with the patient or authorized representative who has indicated his/her understanding and acceptance.     Plan Discussed with: Anesthesiologist  Anesthesia Plan Comments:         Anesthesia Quick Evaluation

## 2014-03-24 NOTE — Addendum Note (Signed)
Addendum created 03/24/14 1423 by Tyrone AppleMichael A. Malen GauzeFoster, MD   Modules edited: Notes Section   Notes Section:  File: [id number]

## 2014-03-24 NOTE — MAU Note (Signed)
Presents with contractions every 5-10 minutes. Having some leaking.  Denies vaginal bleeding. Good fetal movement.

## 2014-03-25 LAB — CBC
HEMATOCRIT: 32.7 % — AB (ref 36.0–46.0)
HEMOGLOBIN: 10.9 g/dL — AB (ref 12.0–15.0)
MCH: 26.5 pg (ref 26.0–34.0)
MCHC: 33.3 g/dL (ref 30.0–36.0)
MCV: 79.6 fL (ref 78.0–100.0)
Platelets: 154 10*3/uL (ref 150–400)
RBC: 4.11 MIL/uL (ref 3.87–5.11)
RDW: 15.8 % — ABNORMAL HIGH (ref 11.5–15.5)
WBC: 9.7 10*3/uL (ref 4.0–10.5)
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
## 2014-03-25 NOTE — Anesthesia Postprocedure Evaluation (Signed)
  Anesthesia Post-op Note  Patient: Beth Calderon  Procedure(s) Performed: Lumbar Epidural for L&D  Patient Location: Rm. 172  Anesthesia Type:Epidural  Level of Consciousness: awake, alert  and oriented  Airway and Oxygen Therapy: Patient Spontanous Breathing  Post-op Pain: none  Post-op Assessment: Post-op Vital signs reviewed, Patient's Cardiovascular Status Stable, Respiratory Function Stable, Patent Airway, No signs of Nausea or vomiting, Pain level controlled, No headache, No backache, No residual numbness and No residual motor weakness  Post-op Vital Signs: Reviewed and stable  Complications: No apparent anesthesia complications

## 2014-03-25 NOTE — Progress Notes (Signed)
Post Partum Day 1 Subjective:  Beth Calderon is a 28 y.o. [phone] 5157w0d with twin delivery, bottle feeding, considering LARC  Objective: Blood pressure 137/84, pulse 84, temperature 97.6 F (36.4 C), temperature source Oral, resp. rate 18, height 5\' 8"  (1.727 m), weight 400 lb 9.6 oz (181.711 kg), last menstrual period 07/08/2013, SpO2 99.00%, unknown if currently breastfeeding.  Physical Exam:  General: alert, cooperative and no distress Lochia:normal flow Chest: CTAB Heart: RRR no m/r/g Abdomen: +BS, soft, nontender,  Uterine Fundus: firm DVT Evaluation: No evidence of DVT seen on physical exam. Extremities: no edema   Recent Labs  03/24/14 0705 03/25/14 0635  HGB 11.4* 10.9*  HCT 34.4* 32.7*    Assessment/Plan:  ASSESSMENT: Beth Calderon is a 28 y.o.  Plan for discharge tomorrow, continue routine postpartum care   LOS: 1 day   Rece Zechman ROCIO 03/25/2014, 9:06 PM

## 2014-03-26 MED ORDER — DOCUSATE SODIUM 100 MG PO CAPS: 100.0000 mg | ORAL_CAPSULE | Freq: Two times a day (BID) | ORAL | Status: DC

## 2014-03-26 NOTE — Discharge Instructions (Signed)

## 2014-03-26 NOTE — Discharge Summary (Signed)
Obstetric Discharge Summary Reason for Admission: onset of labor Prenatal Procedures: NST Intrapartum Procedures: spontaneous vaginal delivery and vacuum Postpartum Procedures: none Complications-Operative and Postpartum: 1st degree perineal laceration Hemoglobin  Date Value Ref Range Status  03/25/2014 10.9* 12.0 - 15.0 g/dL Final     HCT  Date Value Ref Range Status  03/25/2014 32.7* 36.0 - 46.0 % Final    Discharge Diagnoses: Term Pregnancy-delivered  Hospital Course:  Beth Calderon is a 28 y.o. [phone] who presented with SOL.  She had a SVD of baby A and Vaccuum assisted delivery of baby B. She was able to ambulate, tolerate PO and void normally. She was discharged home with instructions for postpartum care.   Delivery Note  Arrived to patient's room after epidural placement and she reported pelvic pressure. Exam confirmed that she was fully dilated, +3 station. Given transverse presentation of Twin B, the decision as made to deliver in the OR. Patient was consented for cesarean section in the event that this would be needed. All questions answered.  Upon arrival to the OR, she was moved to the OR bed and placed in the dorsal lithotomy position. She proceeded to push and delivery of Twin A occurred after two pushes.    Beth Calderon, Beth Calderon [place] [[id number]]   At 11:22 AM a viable female was delivered via Spontaneous Vaginal Delivery (Presentation: Cephalic). APGAR: 9, 9; weight 6 lb 11 oz (3033 g).  Placenta status: Intact, Spontaneous, 3VC Cord: One loose body cord, reduced without difficulty.  After delivery of Twin B, bedside ultrasound showed that Twin B had rotated to cephalic presentation. However, the FHR became difficult to ascertain given maternal habitus; the membranes were ruptured and FSE was placed. The FHR was initially reassuring but was then noted to be in the 80s-100s; fetal station was noted to be 0. The patient was able to push to +2 station and a Kiwi vacuum was placed to  aid in rest of delivery. This was in place over two pushes; had one pop-off. Patient was verbally consented prior to vacuum placement.    Beth Calderon, Beth Calderon [[id number]]   At 11:35 AM a viable female was delivered via Vacuum Assisted Vaginal Delivery (Presentation: Transverse -> Cephalic). APGAR: 9, 9; weight 5 lb 13 oz (2637 g).  Placenta status: Intact, Spontaneous, 3VC. Cord: with the following complications: One nuchal cord, reduced without difficulty.  Anesthesia: Epidural  Episiotomy: None  Lacerations: 1st degree  Suture Repair: 3.0 vicryl  Est. Blood Loss (mL): 300  Mom to postpartum.  Baby A to Couplet care / Skin to Skin.  Baby B to Couplet care / Skin to Skin.  Jaynie Collins, MD, FACOG  Attending Obstetrician & Gynecologist  Faculty Practice, [facility]  Physical Exam:  General: alert, cooperative and no distress Lochia: appropriate Uterine Fundus: firm DVT Evaluation: No evidence of DVT seen on physical exam. Negative Homan's sign. No cords or calf tenderness. No significant calf/ankle edema.  Discharge Information: Date: 03/26/2014 Activity: unrestricted Diet: routine Medications: PNV, Ibuprofen and Colace Baby feeding: plans to bottle feed Contraception: IUD Condition: stable Instructions: refer to practice specific booklet Discharge to: home   Newborn Data:   Jammi, Morrissette [[id number]]  Live born unspecified sex  Birth Weight:  APGARMarland Kitchen    Talicia, Sui [[id number]]  Live born female  Birth Weight: 6 lb 11 oz (3033 g) APGAR: 9, 9   Roosevelt, Bisher [[id number]]  Live born female  Birth Weight: 5 lb 13 oz (  2637 g) APGAR: 9, 9  Home with mother.  Wenda Low, MD [facility] PGY-2 03/26/2014, 9:05 AM  Post Partum Day 2 I have seen and examined this patient and agree with above documentation in the resident's note.  Beth Calderon is a 28 y.o. [phone] s/p SVD of twins.  Pt denies problems with ambulating,  voiding or po intake. Pain is well controlled.  .  Plan for birth control is IUD.  Method of Feeding: bottle PE:  BP 122/80  Pulse 79  Temp(Src) 98 F (36.7 C) (Oral)  Resp 18  Ht  (1.727 m)  Wt 400 lb 9.6 oz (181.711 kg)  BMI 60.93 kg/m2  SpO2 100%  LMP 07/08/2013  Breastfeeding? Unknown Fundus firm Plan for discharge: today

## 2014-03-27 ENCOUNTER — Encounter (HOSPITAL_COMMUNITY): Payer: Self-pay | Admitting: Obstetrics & Gynecology

## 2014-03-27 NOTE — Progress Notes (Signed)
Post dc ur chart review completed. 

## 2014-03-29 ENCOUNTER — Encounter: Payer: Medicaid Other | Admitting: Advanced Practice Midwife

## 2014-05-05 ENCOUNTER — Ambulatory Visit (INDEPENDENT_AMBULATORY_CARE_PROVIDER_SITE_OTHER): Payer: Medicaid Other | Admitting: Obstetrics and Gynecology

## 2014-05-05 ENCOUNTER — Other Ambulatory Visit (HOSPITAL_COMMUNITY)
Admission: RE | Admit: 2014-05-05 | Discharge: 2014-05-05 | Disposition: A | Discharge status: Home / Self Care | Payer: Medicaid Other | Source: Ambulatory Visit | Attending: Obstetrics & Gynecology | Admitting: Obstetrics & Gynecology

## 2014-05-05 DIAGNOSIS — Z3202 Encounter for pregnancy test, result negative: Secondary | ICD-10-CM

## 2014-05-05 DIAGNOSIS — Z01419 Encounter for gynecological examination (general) (routine) without abnormal findings: Secondary | ICD-10-CM | POA: Diagnosis present

## 2014-05-05 DIAGNOSIS — Z124 Encounter for screening for malignant neoplasm of cervix: Secondary | ICD-10-CM

## 2014-05-05 LAB — HCG, QUANTITATIVE, PREGNANCY: hCG, Beta Chain, Quant, S: <2 m[IU]/mL

## 2014-05-05 LAB — POCT PREGNANCY, URINE: Preg Test, Ur: POSITIVE — AB

## 2014-05-05 NOTE — Progress Notes (Signed)
Patient states that she wants iud. She had sex recently but used a condom the entire time. UPT is faintly positive at 3 minutes. Informed Dierdre Poe.

## 2014-05-05 NOTE — Progress Notes (Signed)
  Subjective:     Beth Calderon is a 28 y.o. female [id number] who presents for a postpartum visit. She is 6 wks  postpartum following a NSVD/VAVD with twins on 03/24/14. I have fully reviewed the prenatal and intrapartum course. The delivery was at 37 gestational weeks. Outcome: spontaneous vaginal delivery.of twin A, Kiwi VAVD of twin B.  Anesthesia: epidural. Postpartum course has been uncomplicated. Baby's course has been uncomplicated. Baby is feeding by breast. Bleeding no bleeding. Bowel function is normal. Bladder function is normal Patient is sexually active. Contraception method is condoms She wants Mirena. Postpartum depression screening: negative.  The following portions of the patient's history were reviewed and updated as appropriate: allergies, current medications, past family history, past medical history, past social history, past surgical history and problem list.  Review of Systems Pertinent items are noted in HPI.  Sleeping poorly, fatigued. Last Pap normal 1 yr ago. Has hx abnormal Pap  Objective:    There were no vitals taken for this visit.  General:  alert, cooperative and no distress   Breasts:  inspection negative, no nipple discharge or bleeding, no masses or nodularity palpable  Lungs: clear to auscultation bilaterally  Heart:  regular rate and rhythm, S1, S2 normal, no murmur, click, rub or gallop  Abdomen: soft, non-tender; bowel sounds normal; no masses,  no organomegaly obese, well involuted   Vulva:  NEFG  Vagina: Scant brownish discharge  Cervix:  Friable, posterior, ant lip only visualized using long spec: Pap done  Corpus: Unable to assess due to body habitus  Adnexa:  NP  Rectal Exam: Not performed.        *UPT weakly positive x2 per Marchelle FolksAmanda, RN  Assessment:   6 wks postpartum exam. Pap smear done at today's visit.  Morbid obesity Borderline BP elevation Possible pregnancy, undesired  Plan:    1. Contraception plan: Advised abstinence for 2 wks  and return for Mirena insertion if not pregnant. 2. Quant beta hCG sent 3. Follow up in:2 wks or as needed.

## 2014-05-08 LAB — CYTOLOGY - PAP

## 2014-05-09 ENCOUNTER — Telehealth: Payer: Self-pay | Admitting: *Deleted

## 2014-05-09 NOTE — Telephone Encounter (Signed)
Called patient and left message that we are returning her call.  

## 2014-05-09 NOTE — Telephone Encounter (Signed)
Patient informed of results. Advised to abstain from intercourse or use protection until she comes in for her iud.

## 2014-05-09 NOTE — Telephone Encounter (Signed)
Pt left message requesting results of lab test from her last visit.

## 2014-05-22 ENCOUNTER — Ambulatory Visit (INDEPENDENT_AMBULATORY_CARE_PROVIDER_SITE_OTHER): Payer: Medicaid Other | Admitting: Obstetrics & Gynecology

## 2014-05-22 ENCOUNTER — Encounter: Payer: Self-pay | Admitting: Obstetrics & Gynecology

## 2014-05-22 VITALS — BP 137/91 | HR 73 | Ht 69.0 in | Wt 382.4 lb

## 2014-05-22 DIAGNOSIS — Z30430 Encounter for insertion of intrauterine contraceptive device: Secondary | ICD-10-CM

## 2014-05-22 DIAGNOSIS — Z23 Encounter for immunization: Secondary | ICD-10-CM

## 2014-05-22 LAB — POCT PREGNANCY, URINE: PREG TEST UR: NEGATIVE

## 2014-05-22 MED ORDER — LEVONORGESTREL 20 MCG/24HR IU IUD
INTRAUTERINE_SYSTEM | Freq: Once | INTRAUTERINE | Status: AC
  Administered 2014-05-22: 1 via INTRAUTERINE

## 2014-05-22 NOTE — Progress Notes (Signed)
    GYNECOLOGY CLINIC PROCEDURE NOTE  Beth Calderon is a 28 y.o. [phone] here for Mirena IUD insertion. No GYN concerns.  Last pap smear was on 05/05/14 and was normal.  IUD Insertion Procedure Note Patient identified, informed consent performed.  Discussed risks of irregular bleeding, cramping, infection, malpositioning or misplacement of the IUD outside the uterus which may require further procedure such as laparoscopy. Time out was performed.  Urine pregnancy test negative.  Speculum placed in the vagina.  Cervix visualized.  Cleaned with Betadine x 2.  Grasped anteriorly with a single tooth tenaculum.  Uterus sounded to 9 cm.  Mirena IUD placed per manufacturer's recommendations.  Strings trimmed to 3 cm. Tenaculum was removed, good hemostasis noted.  Patient tolerated procedure well.   [name].  She was advised to have backup contraception for one week.  Patient was also asked to check IUD strings periodically and follow up in 4 weeks for IUD check.  Of note, patient received flu vaccine today.   Jaynie CollinsUGONNA  Zackrey Dyar, MD, FACOG Attending Obstetrician & Gynecologist Center for [facility], [facility]

## 2014-05-22 NOTE — Patient Instructions (Signed)

## 2014-06-05 ENCOUNTER — Encounter: Payer: Self-pay | Admitting: Obstetrics & Gynecology

## 2014-06-12 ENCOUNTER — Ambulatory Visit: Payer: Medicaid Other | Admitting: Obstetrics & Gynecology

## 2014-06-28 ENCOUNTER — Encounter: Payer: Self-pay | Admitting: Obstetrics & Gynecology

## 2014-07-20 ENCOUNTER — Encounter: Payer: Self-pay | Admitting: *Deleted

## 2014-10-17 IMAGING — US US OB COMP +14 WK
1 series · 12 of 28 positions shown · non-contrast
Comparison: none

[Series 1: us ob comp +14 wk · 12 of 64 slices shown]
[im 3/64]
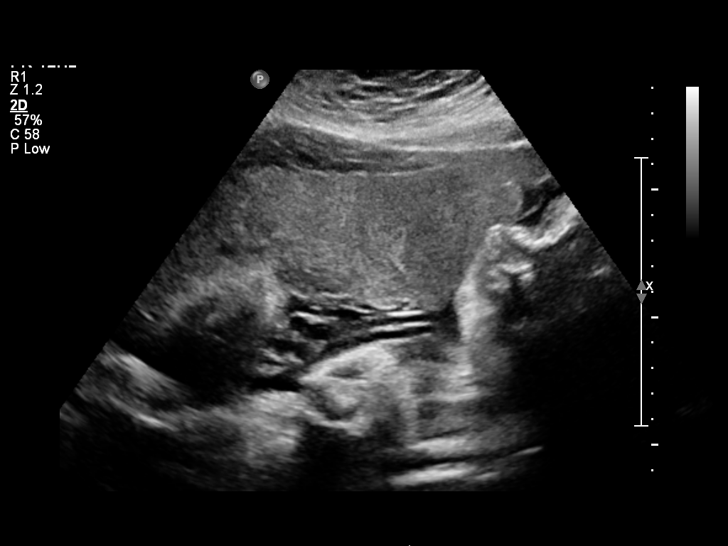
[im 8/64]
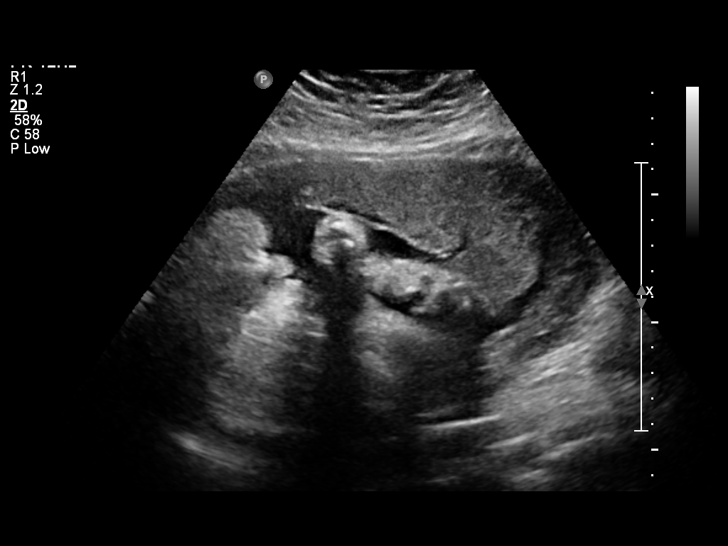
[im 12/64]
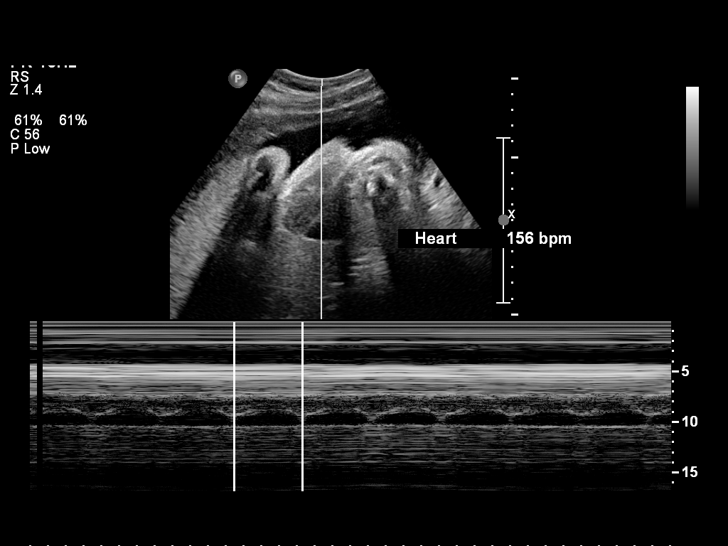
[im 19/64]
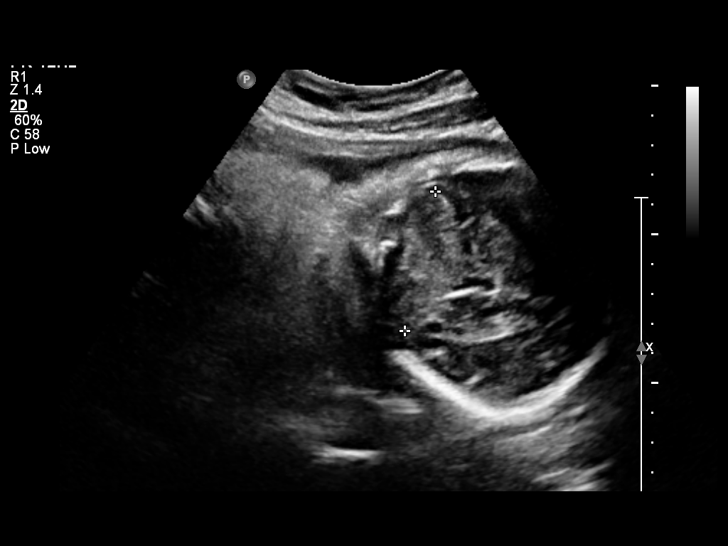
[im 24/64]
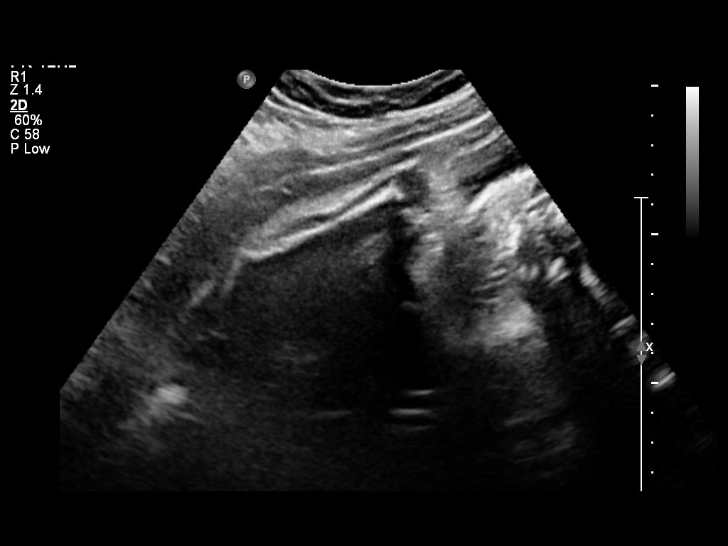
[im 29/64]
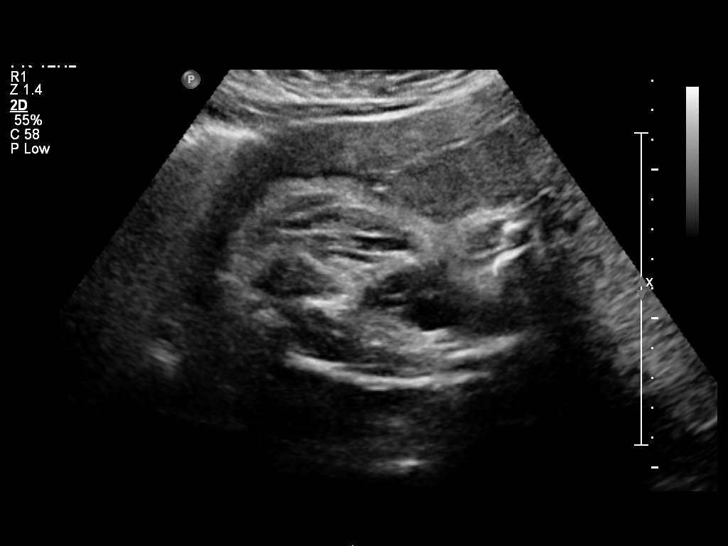
[im 36/64]
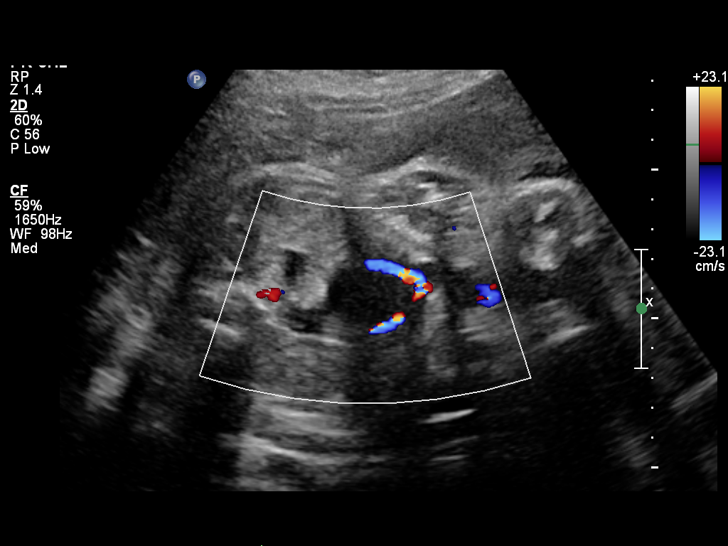
[im 40/64]
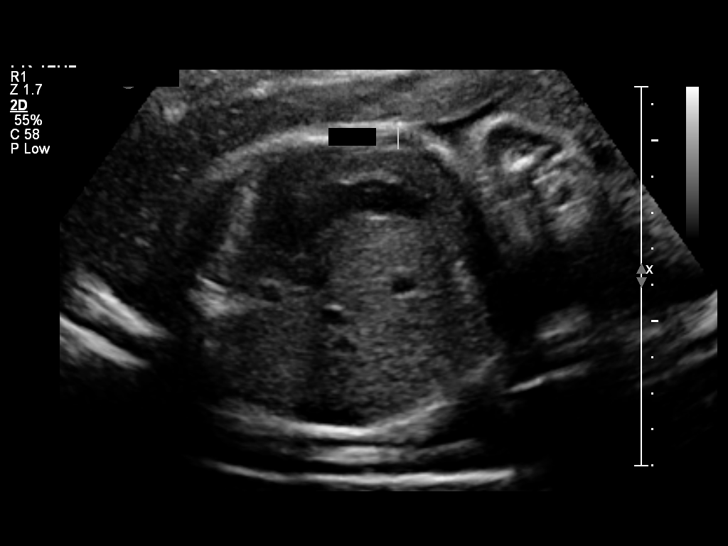
[im 45/64]
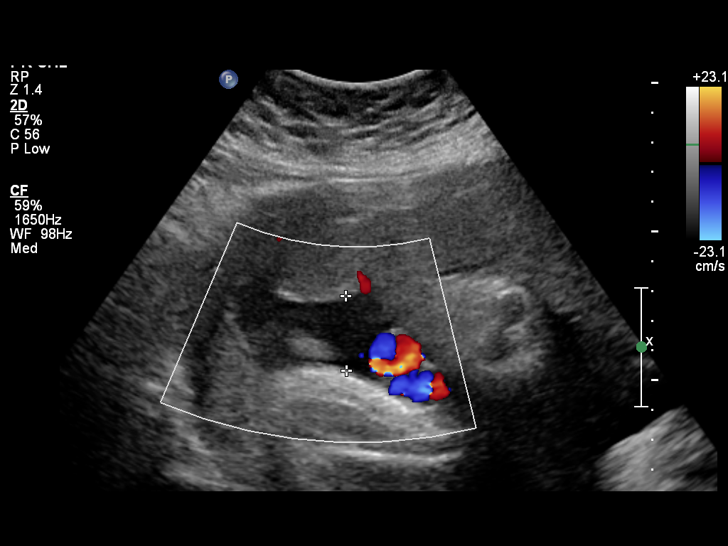
[im 52/64]
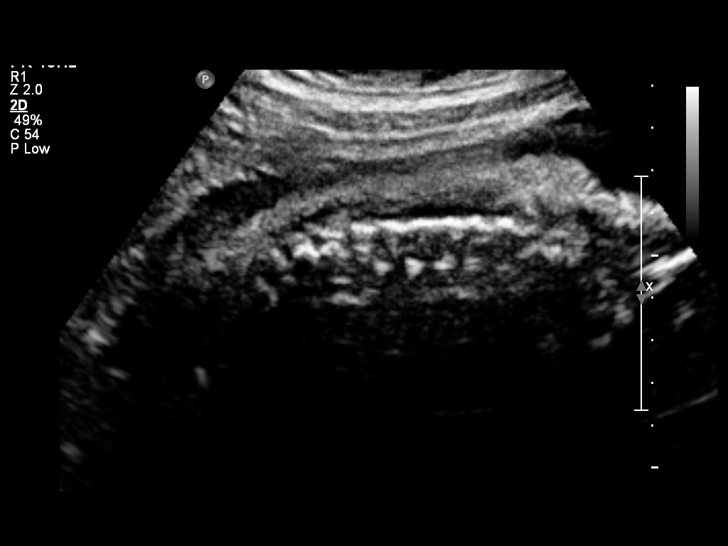
[im 57/64]
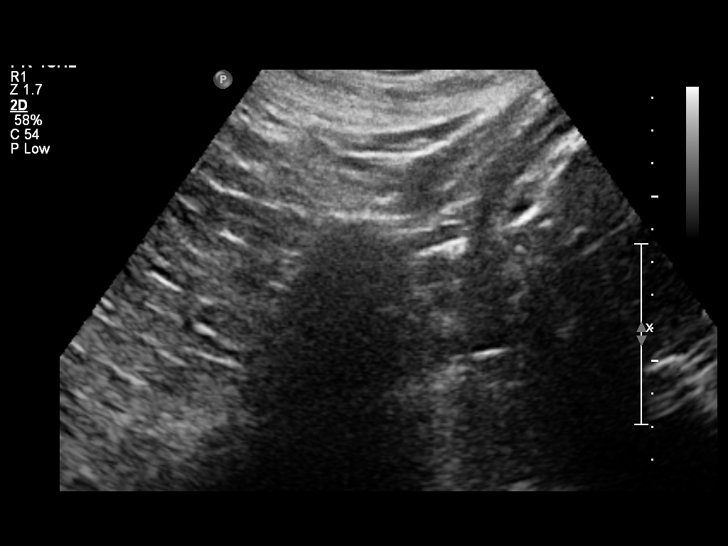
[im 61/64]
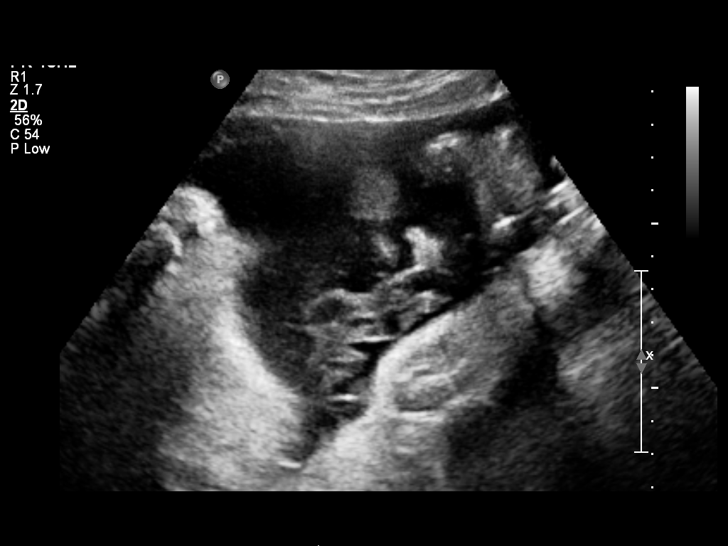

[12 of 28 positions shown; findings below may reference images not displayed]

OBSTETRICS REPORT
                      (Signed Final 01/05/2013 [DATE])

Service(s) Provided

 US OB COMP + 14 WK                                    76805.1
Indications

 No or Little Prenatal Care
 Maternal morbid obesity
Fetal Evaluation

 Num Of Fetuses:    1
 Fetal Heart Rate:  156                         bpm
 Cardiac Activity:  Observed
 Presentation:      Cephalic
 Placenta:          Anterior, above cervical os
 P. Cord            Visualized
 Insertion:

 Amniotic Fluid
 AFI FV:      Subjectively within normal limits
 AFI Sum:     12.38   cm      38   %Tile     Larg Pckt:   3.93   cm
 RUQ:   2.5    cm    RLQ:    3.45   cm    LUQ:   2.5     cm   LLQ:    3.93   cm
Biometry

 BPD:     83.2  mm    G. Age:   33w 3d                CI:        70.58   70 - 86
                                                      FL/HC:      20.9   20.1 -

 HC:     315.7  mm    G. Age:   35w 3d       22  %    HC/AC:      1.07   0.93 -

 AC:     293.7  mm    G. Age:   33w 3d       10  %    FL/BPD:     79.2   71 - 87
 FL:      65.9  mm    G. Age:   34w 0d       14  %    FL/AC:      22.4   20 - 24
 CER:     47.8  mm    G. Age:   N/A        > 95  %

 Est. FW:    3385  gm          5 lb      29  %
Gestational Age

 LMP:           35w 2d       Date:   05/03/12                 EDD:   02/07/13
 U/S Today:     34w 1d                                        EDD:   02/15/13
 Best:          35w 2d    Det. By:   LMP  (05/03/12)          EDD:   02/07/13
Anatomy

 Cranium:          Appears normal         Aortic Arch:      Appears normal
 Fetal Cavum:      Appears normal         Ductal Arch:      Not well visualized
 Ventricles:       Appears normal         Diaphragm:        Appears normal
 Choroid Plexus:   Appears normal         Stomach:          Appears normal
 Cerebellum:       Appears normal         Abdomen:          Appears normal
 Posterior Fossa:  Appears normal         Abdominal Wall:   Appears nml (cord
                                                            insert, abd wall)
 Nuchal Fold:      Not applicable (>20    Cord Vessels:     Appears normal (3
                   wks GA)                                  vessel cord)
 Face:             Orbits appear          Kidneys:          Appear normal
                   normal
 Lips:             Not well visualized    Bladder:          Appears normal
 Heart:            Appears normal         Spine:            Not well visualized
                   (4CH, axis, and
                   situs)
 RVOT:             Not well visualized    Lower             Appears normal
                                          Extremities:
 LVOT:             Not well visualized    Upper             Appears normal
                                          Extremities:

 Other:  Technically difficult due to maternal habitus and fetal position and
         advanced GA.
Cervix Uterus Adnexa

 Cervix:       Not visualized (advanced GA >34 wks)
 Left Ovary:   Not visualized.
 Right Ovary:  Not visualized.
Impression

 Single living IUP.  US EGA is concordant with LMP.
 Suboptimal visualization of anatomy due to advanced GA,
 however no fetal anomalies seen involving visualized
 anatomy.
 Amniotic fluid within normal limits, with AFI of 12.38 cm.

## 2015-09-20 ENCOUNTER — Encounter: Payer: Self-pay | Admitting: Obstetrics and Gynecology

## 2015-09-20 ENCOUNTER — Ambulatory Visit (INDEPENDENT_AMBULATORY_CARE_PROVIDER_SITE_OTHER): Payer: Medicaid Other | Admitting: Obstetrics and Gynecology

## 2015-09-20 ENCOUNTER — Other Ambulatory Visit (HOSPITAL_COMMUNITY)
Admission: RE | Admit: 2015-09-20 | Discharge: 2015-09-20 | Disposition: A | Discharge status: Home / Self Care | Payer: Medicaid Other | Source: Ambulatory Visit | Attending: Obstetrics and Gynecology | Admitting: Obstetrics and Gynecology

## 2015-09-20 VITALS — BP 142/84 | HR 79 | Temp 98.4°F | Ht 69.0 in | Wt >= 6400 oz

## 2015-09-20 DIAGNOSIS — Z1151 Encounter for screening for human papillomavirus (HPV): Secondary | ICD-10-CM | POA: Diagnosis not present

## 2015-09-20 DIAGNOSIS — Z01419 Encounter for gynecological examination (general) (routine) without abnormal findings: Secondary | ICD-10-CM

## 2015-09-20 DIAGNOSIS — Z113 Encounter for screening for infections with a predominantly sexual mode of transmission: Secondary | ICD-10-CM

## 2015-09-20 DIAGNOSIS — Z124 Encounter for screening for malignant neoplasm of cervix: Secondary | ICD-10-CM | POA: Diagnosis not present

## 2015-09-20 DIAGNOSIS — Z3202 Encounter for pregnancy test, result negative: Secondary | ICD-10-CM | POA: Diagnosis not present

## 2015-09-20 DIAGNOSIS — Z0000 Encounter for general adult medical examination without abnormal findings: Secondary | ICD-10-CM

## 2015-09-20 LAB — POCT PREGNANCY, URINE: Preg Test, Ur: NEGATIVE

## 2015-09-20 NOTE — Progress Notes (Signed)
  Subjective:     Beth Calderon is a 30 y.o. female [id number] who is here for a comprehensive physical exam. The patient reports random nausea and vomitting without sick contact. She reports eating 2 meals and having a decrease in appetite. Patient is sexually active using IUD for contraception. The IUD has been in place since 05/2014. She reports 4 days of spotting monthly with the IUD. She desires full STD testing today  Past Medical History  Diagnosis Date  . Anemia   . H/O seasonal allergies    Past Surgical History  Procedure Laterality Date  . No past surgeries    . Vaginal delivery  03/24/2014    Procedure: VAGINAL DELIVERY;  Surgeon: Tereso Newcomer, MD;  Location: [facility];  Service: Obstetrics;;  vaginal delivery of twins   Family History  Problem Relation Age of Onset  . Hypertension Mother   . Hypertension Father   . Hypertension Maternal Uncle   . Kidney disease Maternal Uncle   . Diabetes Maternal Uncle   . Hypertension Maternal Grandmother   . Cancer Maternal Grandmother   . Hypertension Maternal Grandfather   . Hypertension Paternal Grandmother   . Hypertension Paternal Grandfather      Social History   Social History  . Marital Status: Single    Spouse Name: N/A  . Number of Children: N/A  . Years of Education: N/A   Occupational History  . Not on file.   Social History Main Topics  . Smoking status: Never Smoker   . Smokeless tobacco: Never Used  . Alcohol Use: Yes  . Drug Use: Yes    Special: Marijuana  . Sexual Activity: Yes    Birth Control/ Protection: Condom     Comment: stopped ETOH and marijuana with positive preg test   Other Topics Concern  . Not on file   Social History Narrative   Health Maintenance  Topic Date Due  . INFLUENZA VACCINE  03/05/2015  . PAP SMEAR  05/05/2017  . TETANUS/TDAP  01/26/2024  . HIV Screening  Completed       Review of Systems Pertinent items are noted in HPI.   Objective:   Blood pressure  142/84, pulse 79, temperature 98.4 F (36.9 C), temperature source Oral, height  (1.753 m), weight 427 lb (193.686 kg), not currently breastfeeding.     GENERAL: Well-developed, well-nourished female in no acute distress.  HEENT: Normocephalic, atraumatic. Sclerae anicteric.  NECK: Supple. Normal thyroid.  LUNGS: Clear to auscultation bilaterally.  HEART: Regular rate and rhythm. BREASTS: Symmetric in size. No palpable masses or lymphadenopathy, skin changes, or nipple drainage. ABDOMEN: Soft, nontender, nondistended. No organomegaly. PELVIC: Normal external female genitalia. Vagina is pink and rugated.  Normal discharge. Normal appearing cervix with IUD strings visualized a the os. Uterus is normal in size. No adnexal mass or tenderness. EXTREMITIES: No cyanosis, clubbing, or edema, 2+ distal pulses.    Assessment:    Healthy female exam.      Plan:    Pap smear collected HIV, hep B and RPR ordered IUD appears to be in the appropriate location Patient will be contacted with any abnormal results See After Visit Summary for Counseling Recommendations

## 2015-09-21 LAB — RPR

## 2015-09-21 LAB — HEPATITIS B SURFACE ANTIGEN: Hepatitis B Surface Ag: NEGATIVE

## 2015-09-21 LAB — HIV ANTIBODY (ROUTINE TESTING W REFLEX): HIV 1&2 Ab, 4th Generation: NONREACTIVE

## 2015-09-24 LAB — CYTOLOGY - PAP

## 2015-09-25 ENCOUNTER — Telehealth: Payer: Self-pay

## 2015-09-25 MED ORDER — METRONIDAZOLE 500 MG PO TABS: 500.0000 mg | ORAL_TABLET | Freq: Two times a day (BID) | ORAL | Status: AC

## 2015-09-25 NOTE — Telephone Encounter (Signed)
Pt has been informed of Trich and will pick up medicine and start today. She has been instructed to let her partner know about this infection.

## 2015-09-25 NOTE — Addendum Note (Signed)
Addended by: Catalina Antigua on: 09/25/2015 01:25 PM   Modules accepted: Orders
